# Patient Record
Sex: Female | Born: 1995 | Race: Black or African American | Hispanic: No | Marital: Single
Health system: Midwestern US, Community
[De-identification: ages and names within clinical notes are randomized; demographics above are authoritative.]

## PROBLEM LIST (undated history)

## (undated) ENCOUNTER — Inpatient Hospital Stay (HOSPITAL_COMMUNITY): Payer: Self-pay

## (undated) DIAGNOSIS — O039 Complete or unspecified spontaneous abortion without complication: Secondary | ICD-10-CM

## (undated) DIAGNOSIS — F5101 Primary insomnia: Secondary | ICD-10-CM

## (undated) DIAGNOSIS — J0140 Acute pansinusitis, unspecified: Secondary | ICD-10-CM

---

## 2015-05-11 ENCOUNTER — Encounter (HOSPITAL_COMMUNITY): Payer: Self-pay

## 2015-05-11 ENCOUNTER — Emergency Department (HOSPITAL_COMMUNITY)
Admission: EM | Admit: 2015-05-11 | Discharge: 2015-05-11 | Disposition: A | Discharge status: Home / Self Care | Payer: BLUE CROSS/BLUE SHIELD | Attending: Emergency Medicine | Admitting: Emergency Medicine

## 2015-05-11 ENCOUNTER — Emergency Department (HOSPITAL_COMMUNITY): Payer: BLUE CROSS/BLUE SHIELD

## 2015-05-11 DIAGNOSIS — O039 Complete or unspecified spontaneous abortion without complication: Secondary | ICD-10-CM | POA: Diagnosis not present

## 2015-05-11 DIAGNOSIS — R000 Tachycardia, unspecified: Secondary | ICD-10-CM | POA: Insufficient documentation

## 2015-05-11 DIAGNOSIS — Z79899 Other long term (current) drug therapy: Secondary | ICD-10-CM | POA: Insufficient documentation

## 2015-05-11 DIAGNOSIS — Z3A11 11 weeks gestation of pregnancy: Secondary | ICD-10-CM | POA: Insufficient documentation

## 2015-05-11 DIAGNOSIS — O9989 Other specified diseases and conditions complicating pregnancy, childbirth and the puerperium: Secondary | ICD-10-CM | POA: Diagnosis not present

## 2015-05-11 DIAGNOSIS — O209 Hemorrhage in early pregnancy, unspecified: Secondary | ICD-10-CM | POA: Diagnosis present

## 2015-05-11 DIAGNOSIS — N939 Abnormal uterine and vaginal bleeding, unspecified: Secondary | ICD-10-CM

## 2015-05-11 LAB — COMPREHENSIVE METABOLIC PANEL
ALK PHOS: 69 U/L (ref 38–126)
ALT: 17 U/L (ref 14–54)
ANION GAP: 10 (ref 5–15)
AST: 19 U/L (ref 15–41)
Albumin: 4.8 g/dL (ref 3.5–5.0)
BILIRUBIN TOTAL: 0.5 mg/dL (ref 0.3–1.2)
BUN: 13 mg/dL (ref 6–20)
CALCIUM: 9.8 mg/dL (ref 8.9–10.3)
CO2: 24 mmol/L (ref 22–32)
CREATININE: 0.8 mg/dL (ref 0.44–1.00)
Chloride: 107 mmol/L (ref 101–111)
Glucose, Bld: 95 mg/dL (ref 65–99)
Potassium: 3.9 mmol/L (ref 3.5–5.1)
Sodium: 141 mmol/L (ref 135–145)
TOTAL PROTEIN: 9 g/dL — AB (ref 6.5–8.1)

## 2015-05-11 LAB — CBC
HCT: 42 % (ref 36.0–46.0)
Hemoglobin: 13.9 g/dL (ref 12.0–15.0)
MCH: 30.4 pg (ref 26.0–34.0)
MCHC: 33.1 g/dL (ref 30.0–36.0)
MCV: 91.9 fL (ref 78.0–100.0)
PLATELETS: 415 10*3/uL — AB (ref 150–400)
RBC: 4.57 MIL/uL (ref 3.87–5.11)
RDW: 13.9 % (ref 11.5–15.5)
WBC: 5.1 10*3/uL (ref 4.0–10.5)

## 2015-05-11 LAB — ABO/RH: ABO/RH(D): A POS

## 2015-05-11 LAB — LIPASE, BLOOD: Lipase: 21 U/L (ref 11–51)

## 2015-05-11 LAB — I-STAT BETA HCG BLOOD, ED (MC, WL, AP ONLY): HCG, QUANTITATIVE: 78.2 m[IU]/mL — AB (ref <5)

## 2015-05-11 LAB — WET PREP, GENITAL
Sperm: NONE SEEN
Trich, Wet Prep: NONE SEEN
YEAST WET PREP: NONE SEEN

## 2015-05-11 LAB — FIBRINOGEN: FIBRINOGEN: 372 mg/dL (ref 204–475)

## 2015-05-11 LAB — PROTIME-INR
INR: 1.13 (ref 0.00–1.49)
Prothrombin Time: 14.6 seconds (ref 11.6–15.2)

## 2015-05-11 LAB — HCG, QUANTITATIVE, PREGNANCY: HCG, BETA CHAIN, QUANT, S: 71 m[IU]/mL — AB (ref <5)

## 2015-05-11 LAB — LACTATE DEHYDROGENASE: LDH: 148 U/L (ref 98–192)

## 2015-05-11 MED ORDER — OXYCODONE-ACETAMINOPHEN 5-325 MG PO TABS
2.0000 | ORAL_TABLET | Freq: Once | ORAL | Status: AC
  Administered 2015-05-11: 2 via ORAL
  Filled 2015-05-11: qty 2

## 2015-05-11 MED ORDER — NAPROXEN 375 MG PO TABS: 375.0000 mg | ORAL_TABLET | Freq: Two times a day (BID) | ORAL | Status: DC

## 2015-05-11 NOTE — Discharge Instructions (Signed)
HOME CARE INSTRUCTIONS   Your caregiver may order bed rest or may allow you to continue light activity. Resume activity as directed by your caregiver.  Have someone help with home and family responsibilities during this time.   Keep track of the number of sanitary pads you use each day and how soaked (saturated) they are. Write down this information.   Do not use tampons. Do not douche or have sexual intercourse until approved by your caregiver.   Only take over-the-counter or prescription medicines for pain or discomfort as directed by your caregiver.   Do not take aspirin. Aspirin can cause bleeding.   Keep all follow-up appointments with your caregiver.   If you or your partner have problems with grieving, talk to your caregiver or seek counseling to help cope with the pregnancy loss. Allow enough time to grieve before trying to get pregnant again.  SEEK IMMEDIATE MEDICAL CARE IF:   You have severe cramps or pain in your back or abdomen.  You have a fever.  You pass large blood clots (walnut-sized or larger) ortissue from your vagina. Save any tissue for your caregiver to inspect.   Your bleeding increases.   You have a thick, bad-smelling vaginal discharge.  You become lightheaded, weak, or you faint.   You have chills.   Miscarriage A miscarriage is the sudden loss of an unborn baby (fetus) before the 20th week of pregnancy. Most miscarriages happen in the first 3 months of pregnancy. Sometimes, it happens before a woman even knows she is pregnant. A miscarriage is also called a "spontaneous miscarriage" or "early pregnancy loss." Having a miscarriage can be an emotional experience. Talk with your caregiver about any questions you may have about miscarrying, the grieving process, and your future pregnancy plans. CAUSES   Problems with the fetal chromosomes that make it impossible for the baby to develop normally. Problems with the baby's genes or chromosomes  are most often the result of errors that occur, by chance, as the embryo divides and grows. The problems are not inherited from the parents.  Infection of the cervix or uterus.   Hormone problems.   Problems with the cervix, such as having an incompetent cervix. This is when the tissue in the cervix is not strong enough to hold the pregnancy.   Problems with the uterus, such as an abnormally shaped uterus, uterine fibroids, or congenital abnormalities.   Certain medical conditions.   Smoking, drinking alcohol, or taking illegal drugs.   Trauma.  Often, the cause of a miscarriage is unknown.  SYMPTOMS   Vaginal bleeding or spotting, with or without cramps or pain.  Pain or cramping in the abdomen or lower back.  Passing fluid, tissue, or blood clots from the vagina. DIAGNOSIS  Your caregiver will perform a physical exam. You may also have an ultrasound to confirm the miscarriage. Blood or urine tests may also be ordered. TREATMENT   Sometimes, treatment is not necessary if you naturally pass all the fetal tissue that was in the uterus. If some of the fetus or placenta remains in the body (incomplete miscarriage), tissue left behind may become infected and must be removed. Usually, a dilation and curettage (D and C) procedure is performed. During a D and C procedure, the cervix is widened (dilated) and any remaining fetal or placental tissue is gently removed from the uterus.  Antibiotic medicines are prescribed if there is an infection. Other medicines may be given to reduce the size of the uterus (  contract) if there is a lot of bleeding.  If you have Rh negative blood and your baby was Rh positive, you will need a Rh immunoglobulin shot. This shot will protect any future baby from having Rh blood problems in future pregnancies. MAKE SURE YOU:  Understand these instructions.  Will watch your condition.  Will get help right away if you are not doing well or get worse.     This information is not intended to replace advice given to you by your health care provider. Make sure you discuss any questions you have with your health care provider.   Document Released: 10/12/2000 Document Revised: 08/13/2012 Document Reviewed: 06/07/2011 Elsevier Interactive Patient Education [employer]

## 2015-05-11 NOTE — ED Provider Notes (Signed)
CSN: [id number]     Arrival date & time 05/11/15  1530 History   First MD Initiated Contact with Patient 05/11/15 1906     Chief Complaint  Patient presents with  . Abdominal Cramping  . Vaginal Bleeding     (Consider location/radiation/quality/duration/timing/severity/associated sxs/prior Treatment) HPI   Heather Webster is a(n) 20 y.o. female who presents to the ED with cc of vaginal bleeding and cramping. Her last period was in late October and she is currently pregnant. She is G3P0020. She complains of dyspareunia over the last 3 days. She had intercourse with her boyfriend last night. This morning she awoke and had some vaginal spotting. Around 9:00 AM she had intercourse again but had to stop because of pain. Patient states that after intercourse she had heavy vaginal bleeding. Her friend who is visiting solid bleeding and encouraged her to come to the emergency department for further evaluation. At any vaginal discharge, foul odor. She is monogamous with one female partner.   History reviewed. No pertinent past medical history. History reviewed. No pertinent past surgical history. History reviewed. No pertinent family history. Social History  Substance Use Topics  . Smoking status: Never Smoker   . Smokeless tobacco: None  . Alcohol Use: No   OB History    Gravida Para Term Preterm AB TAB SAB Ectopic Multiple Living   1              Review of Systems Ten systems reviewed and are negative for acute change, except as noted in the HPI.    Allergies  Review of patient's allergies indicates no known allergies.  Home Medications   Prior to Admission medications   Medication Sig Start Date End Date Taking? Authorizing Provider  Prenatal Vit-Fe Fumarate-FA (PRENATAL MULTIVITAMIN) TABS tablet Take 1 tablet by mouth daily at 12 noon.   Yes Historical Provider, MD   BP 132/89 mmHg  Pulse 108  Temp(Src) 98 F (36.7 C) (Oral)  Resp 18  SpO2 100% Physical Exam  Constitutional: She  is oriented to person, place, and time. She appears well-developed and well-nourished. No distress.  HENT:  Head: Normocephalic and atraumatic.  Eyes: Conjunctivae are normal. No scleral icterus.  Neck: Normal range of motion.  Cardiovascular: Normal rate, regular rhythm and normal heart sounds.  Exam reveals no gallop and no friction rub.   No murmur heard. Pulmonary/Chest: Effort normal and breath sounds normal. No respiratory distress.  Abdominal: Soft. Bowel sounds are normal. She exhibits no distension and no mass. There is no tenderness. There is no guarding.  Genitourinary:  Pelvic exam: VULVA: normal appearing vulva with no masses, tenderness or lesions, VAGINA: normal appearing vagina with normal color and discharge, no lesions, CERVIX: cervical discharge present - mucoid and bloody, UTERUS: uterus is normal size, shape, consistency and nontender, ADNEXA: normal adnexa in size,no masses, exam limited by diffuse tenderness.   Neurological: She is alert and oriented to person, place, and time.  Skin: Skin is warm and dry. She is not diaphoretic.    ED Course  Procedures (including critical care time) Labs Review Labs Reviewed  WET PREP, GENITAL - Abnormal; Notable for the following:    Clue Cells Wet Prep HPF POC PRESENT (*)    WBC, Wet Prep HPF POC FEW (*)    All other components within normal limits  COMPREHENSIVE METABOLIC PANEL - Abnormal; Notable for the following:    Total Protein 9.0 (*)    All other components within normal limits  CBC -  Abnormal; Notable for the following:    Platelets 415 (*)    All other components within normal limits  I-STAT BETA HCG BLOOD, ED ([facility], [facility], [facility] ONLY) - Abnormal; Notable for the following:    I-stat hCG, quantitative 78.2 (*)    All other components within normal limits  LIPASE, BLOOD  LACTATE DEHYDROGENASE  PROTIME-INR  FIBRINOGEN  URINALYSIS, ROUTINE W REFLEX MICROSCOPIC (NOT AT [facility])  HCG, QUANTITATIVE, PREGNANCY  RPR  HIV  ANTIBODY (ROUTINE TESTING)  HAPTOGLOBIN  ABO/RH  GC/CHLAMYDIA PROBE AMP ([place]) NOT AT [facility]    Imaging Review No results found. I have personally reviewed and evaluated these images and lab results as part of my medical decision-making.   EKG Interpretation None      MDM   Final diagnoses:  Spontaneous miscarriage    Patient with low hCG for her expected gestational age of [redacted] weeks. Mild tachycardia and persistent bleeding. No products of retained conception in the ultrasound. She does have a thickened endometrium. Workup is suggestive of a spontaneous abortion. Patient has had 2 prior miscarriages. I spoke with Dr. Adrian BlackwaterStinson, who is on-call for OB/GYN. The patient [facility] quantitative [facility]. I discussed reasons to seek immediate care at the emergency department. No intercourse until she is cleared by OB/GYN. Naproxen at discharge. Patient appears safe for discharge at this time    Arthor Captainbigail Ranisha Allaire, PA-C 05/11/15 2220  Tilden FossaElizabeth Rees, MD 05/12/15 Moses [id number]

## 2015-05-11 NOTE — ED Notes (Addendum)
Pt c/o abdominal cramping and vaginal bleeding starting this morning.  Pain score 8/10.  Pt has not taken anything for symptoms.  Pt believes she is around [redacted] weeks pregnant.  Sts "I was told on November 26th that I was pregnant and they thought I was around 6 weeks."  Hx of prior miscarriages.         When asked to quantify the bleeding, the Pt reported that her panties was wet w/ blood when she woke up.  Since, she has been lining her panties w/ toilet paper and it has been adequate.  Pt provided a pad.

## 2015-05-11 NOTE — ED Notes (Signed)
Patient transported to Ultrasound 

## 2015-05-11 NOTE — ED Notes (Signed)
PA at bedside.

## 2015-05-12 ENCOUNTER — Emergency Department (HOSPITAL_COMMUNITY)
Admission: EM | Admit: 2015-05-12 | Discharge: 2015-05-12 | Disposition: A | Discharge status: Home / Self Care | Payer: BLUE CROSS/BLUE SHIELD | Attending: Emergency Medicine | Admitting: Emergency Medicine

## 2015-05-12 ENCOUNTER — Encounter (HOSPITAL_COMMUNITY): Payer: Self-pay | Admitting: Family Medicine

## 2015-05-12 DIAGNOSIS — Z3A00 Weeks of gestation of pregnancy not specified: Secondary | ICD-10-CM | POA: Insufficient documentation

## 2015-05-12 DIAGNOSIS — O209 Hemorrhage in early pregnancy, unspecified: Secondary | ICD-10-CM | POA: Diagnosis present

## 2015-05-12 DIAGNOSIS — O039 Complete or unspecified spontaneous abortion without complication: Secondary | ICD-10-CM | POA: Diagnosis not present

## 2015-05-12 DIAGNOSIS — Z791 Long term (current) use of non-steroidal anti-inflammatories (NSAID): Secondary | ICD-10-CM | POA: Insufficient documentation

## 2015-05-12 DIAGNOSIS — N939 Abnormal uterine and vaginal bleeding, unspecified: Secondary | ICD-10-CM

## 2015-05-12 DIAGNOSIS — Z79899 Other long term (current) drug therapy: Secondary | ICD-10-CM | POA: Insufficient documentation

## 2015-05-12 HISTORY — DX: Complete or unspecified spontaneous abortion without complication: O03.9

## 2015-05-12 LAB — CBC
HCT: 36.7 % (ref 36.0–46.0)
Hemoglobin: 12.3 g/dL (ref 12.0–15.0)
MCH: 30 pg (ref 26.0–34.0)
MCHC: 33.5 g/dL (ref 30.0–36.0)
MCV: 89.5 fL (ref 78.0–100.0)
PLATELETS: 353 10*3/uL (ref 150–400)
RBC: 4.1 MIL/uL (ref 3.87–5.11)
RDW: 13.6 % (ref 11.5–15.5)
WBC: 7.2 10*3/uL (ref 4.0–10.5)

## 2015-05-12 LAB — HIV ANTIBODY (ROUTINE TESTING W REFLEX): HIV Screen 4th Generation wRfx: NONREACTIVE

## 2015-05-12 LAB — BASIC METABOLIC PANEL
Anion gap: 9 (ref 5–15)
BUN: 10 mg/dL (ref 6–20)
CALCIUM: 9.2 mg/dL (ref 8.9–10.3)
CO2: 23 mmol/L (ref 22–32)
CREATININE: 0.58 mg/dL (ref 0.44–1.00)
Chloride: 108 mmol/L (ref 101–111)
GFR calc non Af Amer: >60 mL/min (ref >60)
GLUCOSE: 103 mg/dL — AB (ref 65–99)
Potassium: 3.5 mmol/L (ref 3.5–5.1)
Sodium: 140 mmol/L (ref 135–145)

## 2015-05-12 LAB — GC/CHLAMYDIA PROBE AMP (~~LOC~~) NOT AT ARMC
CHLAMYDIA, DNA PROBE: NEGATIVE
NEISSERIA GONORRHEA: NEGATIVE

## 2015-05-12 LAB — RPR: RPR: NONREACTIVE

## 2015-05-12 MED ORDER — ONDANSETRON HCL 4 MG/2ML IJ SOLN
4.0000 mg | Freq: Once | INTRAMUSCULAR | Status: AC
  Administered 2015-05-12: 4 mg via INTRAVENOUS
  Filled 2015-05-12: qty 2

## 2015-05-12 MED ORDER — OXYCODONE-ACETAMINOPHEN 5-325 MG PO TABS: 1.0000 | ORAL_TABLET | Freq: Four times a day (QID) | ORAL | Status: DC | PRN

## 2015-05-12 MED ORDER — KETOROLAC TROMETHAMINE 30 MG/ML IJ SOLN
30.0000 mg | Freq: Once | INTRAMUSCULAR | Status: AC
  Administered 2015-05-12: 30 mg via INTRAVENOUS
  Filled 2015-05-12: qty 1

## 2015-05-12 MED ORDER — SODIUM CHLORIDE 0.9 % IV BOLUS (SEPSIS)
1000.0000 mL | Freq: Once | INTRAVENOUS | Status: AC
  Administered 2015-05-12: 1000 mL via INTRAVENOUS

## 2015-05-12 MED ORDER — MORPHINE SULFATE (PF) 4 MG/ML IV SOLN
2.0000 mg | Freq: Once | INTRAVENOUS | Status: AC
  Administered 2015-05-12: 2 mg via INTRAVENOUS
  Filled 2015-05-12: qty 1

## 2015-05-12 MED ORDER — OXYCODONE-ACETAMINOPHEN 5-325 MG PO TABS
2.0000 | ORAL_TABLET | Freq: Once | ORAL | Status: AC
  Administered 2015-05-12: 2 via ORAL
  Filled 2015-05-12: qty 2

## 2015-05-12 NOTE — ED Notes (Signed)
[facility]. Pt is having increase pain and vaginal bleeding. Friend reports she is going through 3 pads in an hour.

## 2015-05-12 NOTE — Discharge Instructions (Signed)
Take naproxen for mild to moderate pain and Percocet as needed for severe pain. Follow-up with [facility] or a recheck of your hCG level on 05/13/2015. Return to [facility] emergency department if symptoms worsen. You may continue bleeding over the next few days. You should be seen prior to your scheduled follow-up if you develop a fever, severe lightheadedness or dizziness, or loss of consciousness. If bleeding becomes severe, bleeding through more than 1 pad every hour for greater than or equal to 3 hours, follow up with [facility] ED as well. Return to this ED as needed for worsening symptoms.  Miscarriage A miscarriage is the sudden loss of an unborn baby (fetus) before the 20th week of pregnancy. Most miscarriages happen in the first 3 months of pregnancy. Sometimes, it happens before a woman even knows she is pregnant. A miscarriage is also called a "spontaneous miscarriage" or "early pregnancy loss." Having a miscarriage can be an emotional experience. Talk with your caregiver about any questions you may have about miscarrying, the grieving process, and your future pregnancy plans. CAUSES   Problems with the fetal chromosomes that make it impossible for the baby to develop normally. Problems with the baby's genes or chromosomes are most often the result of errors that occur, by chance, as the embryo divides and grows. The problems are not inherited from the parents.  Infection of the cervix or uterus.   Hormone problems.   Problems with the cervix, such as having an incompetent cervix. This is when the tissue in the cervix is not strong enough to hold the pregnancy.   Problems with the uterus, such as an abnormally shaped uterus, uterine fibroids, or congenital abnormalities.   Certain medical conditions.   Smoking, drinking alcohol, or taking illegal drugs.   Trauma.  Often, the cause of a miscarriage is unknown.  SYMPTOMS   Vaginal bleeding or spotting, with  or without cramps or pain.  Pain or cramping in the abdomen or lower back.  Passing fluid, tissue, or blood clots from the vagina. DIAGNOSIS  Your caregiver will perform a physical exam. You may also have an ultrasound to confirm the miscarriage. Blood or urine tests may also be ordered. TREATMENT   Sometimes, treatment is not necessary if you naturally pass all the fetal tissue that was in the uterus. If some of the fetus or placenta remains in the body (incomplete miscarriage), tissue left behind may become infected and must be removed. Usually, a dilation and curettage (D and C) procedure is performed. During a D and C procedure, the cervix is widened (dilated) and any remaining fetal or placental tissue is gently removed from the uterus.  Antibiotic medicines are prescribed if there is an infection. Other medicines may be given to reduce the size of the uterus (contract) if there is a lot of bleeding.  If you have Rh negative blood and your baby was Rh positive, you will need a Rh immunoglobulin shot. This shot will protect any future baby from having Rh blood problems in future pregnancies. HOME CARE INSTRUCTIONS   Your caregiver may order bed rest or may allow you to continue light activity. Resume activity as directed by your caregiver.  Have someone help with home and family responsibilities during this time.   Keep track of the number of sanitary pads you use each day and how soaked (saturated) they are. Write down this information.   Do not use tampons. Do not douche or have sexual intercourse until approved by  your caregiver.   Only take over-the-counter or prescription medicines for pain or discomfort as directed by your caregiver.   Do not take aspirin. Aspirin can cause bleeding.   Keep all follow-up appointments with your caregiver.   If you or your partner have problems with grieving, talk to your caregiver or seek counseling to help cope with the pregnancy loss.  Allow enough time to grieve before trying to get pregnant again.  SEEK IMMEDIATE MEDICAL CARE IF:   You have severe cramps or pain in your back or abdomen.  You have a fever.  You pass large blood clots (walnut-sized or larger) ortissue from your vagina. Save any tissue for your caregiver to inspect.   Your bleeding increases.   You have a thick, bad-smelling vaginal discharge.  You become lightheaded, weak, or you faint.   You have chills.  MAKE SURE YOU:  Understand these instructions.  Will watch your condition.  Will get help right away if you are not doing well or get worse.   This information is not intended to replace advice given to you by your health care provider. Make sure you discuss any questions you have with your health care provider.   Document Released: 10/12/2000 Document Revised: 08/13/2012 Document Reviewed: 06/07/2011 Elsevier Interactive Patient Education [employer]

## 2015-05-12 NOTE — ED Notes (Signed)
Unsuccessful IV attempt by this nurse. Another nurse will attempt

## 2015-05-12 NOTE — ED Notes (Signed)
Pt states that she does not want any blood work drawn

## 2015-05-12 NOTE — ED Provider Notes (Signed)
CSN: [id number]     Arrival date & time 05/12/15  0109 History   First MD Initiated Contact with Patient 05/12/15 0135     Chief Complaint  Patient presents with  . Vaginal Bleeding     (Consider location/radiation/quality/duration/timing/severity/associated sxs/prior Treatment) HPI Comments: Patient is a 20 year old [id number] female who presents to the emergency department a few hours following discharge for worsening abdominal pain and vaginal bleeding. She was previously seen and diagnosed with a likely miscarriage. She has instruction to f/u at the [facility] in 2 days. She reported that her last period was in late October. She had dyspareunia over the last 3 days and started experiencing vaginal spotting yesterday morning. She reports that over the last 1-2 hours she has soaked through 3 pads due to worsening vaginal bleeding. She reports passing some clots. Her pain has also worsened and is sharp, present in her pelvic and suprapubic region. She previously had adequate pain control with Percocet. She was not able to take any of the naproxen prescribed to her following discharge. Patient has not had any fever or loss of consciousness. No vomiting. She reported monogamy with 1 female partner.  Patient is a 20 y.o. female presenting with vaginal bleeding. The history is provided by the patient. No language interpreter was used.  Vaginal Bleeding Associated symptoms: no fever     Past Medical History  Diagnosis Date  . Miscarriage    History reviewed. No pertinent past surgical history. History reviewed. No pertinent family history. Social History  Substance Use Topics  . Smoking status: Never Smoker   . Smokeless tobacco: None  . Alcohol Use: No   OB History    Gravida Para Term Preterm AB TAB SAB Ectopic Multiple Living   1               Review of Systems  Constitutional: Negative for fever.  Gastrointestinal: Negative for vomiting.  Genitourinary: Positive for  vaginal bleeding and pelvic pain.  Neurological: Negative for syncope.  All other systems reviewed and are negative.   Allergies  Review of patient's allergies indicates no known allergies.  Home Medications   Prior to Admission medications   Medication Sig Start Date End Date Taking? Authorizing Provider  Prenatal Vit-Fe Fumarate-FA (PRENATAL MULTIVITAMIN) TABS tablet Take 1 tablet by mouth daily at 12 noon.   Yes Historical Provider, MD  naproxen (NAPROSYN) 375 MG tablet Take 1 tablet (375 mg total) by mouth 2 (two) times daily. 05/11/15   Abigail Harris, PA-C   BP 100/89 mmHg  Pulse 111  Temp(Src) 98.1 F (36.7 C) (Oral)  Resp 20  SpO2 99%   Physical Exam  Constitutional: She is oriented to person, place, and time. She appears well-developed and well-nourished. No distress.  Patient appears uncomfortable  HENT:  Head: Normocephalic and atraumatic.  Eyes: Conjunctivae and EOM are normal. No scleral icterus.  Neck: Normal range of motion.  Cardiovascular: Regular rhythm and intact distal pulses.   Pulmonary/Chest: Effort normal. No respiratory distress. She has no wheezes.  Respirations even and unlabored  Abdominal: Soft. Normal appearance. She exhibits no distension and no ascites. There is tenderness. There is no rebound and no tenderness at McBurney's point.    Soft, nondistended abdomen with tenderness to palpation in the suprapubic abdomen. No masses or peritoneal signs.  Musculoskeletal: Normal range of motion.  Neurological: She is alert and oriented to person, place, and time. She exhibits normal muscle tone. Coordination normal.  GCS 15. Patient moving  all extremities.  Skin: Skin is warm and dry. No rash noted. She is not diaphoretic. No erythema. No pallor.  Psychiatric: Her behavior is normal. Her mood appears anxious.  Nursing note and vitals reviewed.   ED Course  Procedures (including critical care time) Labs Review Labs Reviewed  BASIC METABOLIC PANEL -  Abnormal; Notable for the following:    Glucose, Bld 103 (*)    All other components within normal limits  CBC    Imaging Review [place] Ob Comp Less 14 Wks  05/11/2015  CLINICAL DATA:  Pregnant patient with vaginal bleeding and right-sided abdominal pain. Beta HCG 78. Rule out ectopic or torsion. Patient reports approximately [redacted] weeks pregnant. EXAM: OBSTETRIC <14 WK US AND TRANSVAGINAL OB US DOPPLER ULTRASOUND OF OVARIES TECHNIQUE: Both transabdominal and transvaginal ultrasound examinations were performed for complete evaluation of the gestation as well as the maternal uterus, adnexal regions, and pelvic cul-de-sac. Transvaginal technique was performed to assess early pregnancy. Color and duplex Doppler ultrasound was utilized to evaluate blood flow to the ovaries. COMPARISON:  None. FINDINGS: Intrauterine gestational sac: Not present. Yolk sac:  Not present. Embryo:  Not present. Maternal uterus/adnexae: The uterus is retroverted measuring 7.7 x 5.1 x 4.8 cm. There is no intrauterine gestational sac. Diffuse thickening of the endometrium measuring up to 1.8 cm containing heterogeneous echogenicity and small amount of fluid. The right ovary appears normal measuring 2.9 x 0.8 x 2.1 cm. Blood flow seen. No right adnexal mass. The left ovary appears normal measuring 2.3 x 2.0 x 2.9 cm with blood flow. No left adnexal mass. Trace pelvic free fluid. Pulsed Doppler evaluation of both ovaries demonstrates normal appearing low-resistance arterial and venous waveforms. IMPRESSION: 1. No intrauterine gestation. No findings to suggest ectopic pregnancy. Heterogeneous thickened endometrium, may reflect failed pregnancy versus less likely very early intrauterine pregnancy given the degree of heterogeneity and last menstrual period. Recommend continued trending of beta HCG, and sonographic follow-up as indicated. 2. Normal appearance of both ovaries. Normal blood flow. No ovarian torsion, particularly on the right.  Electronically Signed   By: Rubye OaksMelanie  Ehinger M.D.   On: 05/11/2015 20:51   [place] Ob Transvaginal  05/11/2015  CLINICAL DATA:  Pregnant patient with vaginal bleeding and right-sided abdominal pain. Beta HCG 78. Rule out ectopic or torsion. Patient reports approximately [redacted] weeks pregnant. EXAM: OBSTETRIC <14 WK US AND TRANSVAGINAL OB US DOPPLER ULTRASOUND OF OVARIES TECHNIQUE: Both transabdominal and transvaginal ultrasound examinations were performed for complete evaluation of the gestation as well as the maternal uterus, adnexal regions, and pelvic cul-de-sac. Transvaginal technique was performed to assess early pregnancy. Color and duplex Doppler ultrasound was utilized to evaluate blood flow to the ovaries. COMPARISON:  None. FINDINGS: Intrauterine gestational sac: Not present. Yolk sac:  Not present. Embryo:  Not present. Maternal uterus/adnexae: The uterus is retroverted measuring 7.7 x 5.1 x 4.8 cm. There is no intrauterine gestational sac. Diffuse thickening of the endometrium measuring up to 1.8 cm containing heterogeneous echogenicity and small amount of fluid. The right ovary appears normal measuring 2.9 x 0.8 x 2.1 cm. Blood flow seen. No right adnexal mass. The left ovary appears normal measuring 2.3 x 2.0 x 2.9 cm with blood flow. No left adnexal mass. Trace pelvic free fluid. Pulsed Doppler evaluation of both ovaries demonstrates normal appearing low-resistance arterial and venous waveforms. IMPRESSION: 1. No intrauterine gestation. No findings to suggest ectopic pregnancy. Heterogeneous thickened endometrium, may reflect failed pregnancy versus less likely very early intrauterine pregnancy given the  degree of heterogeneity and last menstrual period. Recommend continued trending of beta HCG, and sonographic follow-up as indicated. 2. Normal appearance of both ovaries. Normal blood flow. No ovarian torsion, particularly on the right. Electronically Signed   By: Rubye Oaks M.D.   On: 05/11/2015 20:51    [place] Art/ven Flow Abd Pelv Doppler Limited  05/11/2015  CLINICAL DATA:  Pregnant patient with vaginal bleeding and right-sided abdominal pain. Beta HCG 78. Rule out ectopic or torsion. Patient reports approximately [redacted] weeks pregnant. EXAM: OBSTETRIC <14 WK [place] AND TRANSVAGINAL OB US DOPPLER ULTRASOUND OF OVARIES TECHNIQUE: Both transabdominal and transvaginal ultrasound examinations were performed for complete evaluation of the gestation as well as the maternal uterus, adnexal regions, and pelvic cul-de-sac. Transvaginal technique was performed to assess early pregnancy. Color and duplex Doppler ultrasound was utilized to evaluate blood flow to the ovaries. COMPARISON:  None. FINDINGS: Intrauterine gestational sac: Not present. Yolk sac:  Not present. Embryo:  Not present. Maternal uterus/adnexae: The uterus is retroverted measuring 7.7 x 5.1 x 4.8 cm. There is no intrauterine gestational sac. Diffuse thickening of the endometrium measuring up to 1.8 cm containing heterogeneous echogenicity and small amount of fluid. The right ovary appears normal measuring 2.9 x 0.8 x 2.1 cm. Blood flow seen. No right adnexal mass. The left ovary appears normal measuring 2.3 x 2.0 x 2.9 cm with blood flow. No left adnexal mass. Trace pelvic free fluid. Pulsed Doppler evaluation of both ovaries demonstrates normal appearing low-resistance arterial and venous waveforms. IMPRESSION: 1. No intrauterine gestation. No findings to suggest ectopic pregnancy. Heterogeneous thickened endometrium, may reflect failed pregnancy versus less likely very early intrauterine pregnancy given the degree of heterogeneity and last menstrual period. Recommend continued trending of beta HCG, and sonographic follow-up as indicated. 2. Normal appearance of both ovaries. Normal blood flow. No ovarian torsion, particularly on the right. Electronically Signed   By: Rubye Oaks M.D.   On: 05/11/2015 20:51   I have personally reviewed and evaluated these  images and lab results as part of my medical decision-making.   EKG Interpretation None      3:13 AM Patient states pain has improved. Hgb fairly stable compared to prior work up. Patient is not orthostatic. Tachycardia has resolved with pain control. Abdominal exam improved. Patient has had mild/minimal bleeding since arrival despite reporting soaking through 3 pads in a 1 hour period. Will continue to monitor and reassess in 1 hour.  4:13 AM Patient reassessed. She has had no further bleeding compared to 1 hour ago. Pain is well controlled. Have discussed the need to f/u with [facility] in 2 days. Return precautions discussed at bedside. Patient verbalizes understanding. MDM   Final diagnoses:  Spontaneous miscarriage  Vaginal bleeding    20 year old female presents to the emergency department for worsening abdominal pain and vaginal bleeding. She was discharged from the ED 3 hours prior to return after being diagnosed with a likely miscarriage. Patient reports that her pain medication were off and she was unable to fill her prescription for additional pain control. She also reports that her bleeding has worsened and that she soaked through 3 pads in a 1 hour period. She denies any loss of consciousness or lightheadedness. No fever since discharge.  Patient treated in the emergency department with IV fluids as well as Toradol and morphine. She has had adequate pain control with these medications and has been resting comfortably over the past 3 hours. Patient has also not had continued bleeding since  arrival. Her hemoglobin is stable compared to her prior evaluation. Patient is not orthostatic and her tachycardia has resolved with pain control.  Patient is to follow-up with [facility] on 05/13/2015 for a recheck/trending of her hCG, per Dr. Adrian Blackwater who was consulted by Manuela Neptune at initial ED visit. I believe the patient is stable for discharge for outpatient follow-up at this  time. Will prescribe Percocet for additional pain control as needed. Patient has been told to return to [facility] for worsening symptoms. Return precautions discussed at bedside and provided at discharge. Patient agreeable to plan with no unaddressed concerns. Patient discharged in satisfactory condition with no unaddressed concerns.   Filed Vitals:   05/12/15 0129 05/12/15 0247  BP: 100/89 114/81  Pulse: 111 85  Temp: 98.1 F (36.7 C)   TempSrc: Oral   Resp: 20 20  SpO2: 99% 100%     Antony Madura, PA-C 05/12/15 0450  Gilda Crease, MD 05/20/15 [phone]

## 2015-05-13 LAB — HAPTOGLOBIN: Haptoglobin: 168 mg/dL (ref 34–200)

## 2015-05-14 ENCOUNTER — Other Ambulatory Visit: Payer: BLUE CROSS/BLUE SHIELD

## 2015-05-14 DIAGNOSIS — O3680X0 Pregnancy with inconclusive fetal viability, not applicable or unspecified: Secondary | ICD-10-CM

## 2015-05-15 LAB — HCG, QUANTITATIVE, PREGNANCY: hCG, Beta Chain, Quant, S: 6.9 m[IU]/mL — ABNORMAL HIGH

## 2015-07-21 ENCOUNTER — Inpatient Hospital Stay (HOSPITAL_COMMUNITY)
Admission: AD | Admit: 2015-07-21 | Discharge: 2015-07-21 | Disposition: A | Discharge status: Home / Self Care | Payer: BLUE CROSS/BLUE SHIELD | Source: Ambulatory Visit | Attending: Obstetrics & Gynecology | Admitting: Obstetrics & Gynecology

## 2015-07-21 ENCOUNTER — Encounter (HOSPITAL_COMMUNITY): Payer: Self-pay

## 2015-07-21 DIAGNOSIS — B9689 Other specified bacterial agents as the cause of diseases classified elsewhere: Secondary | ICD-10-CM | POA: Diagnosis not present

## 2015-07-21 DIAGNOSIS — N939 Abnormal uterine and vaginal bleeding, unspecified: Secondary | ICD-10-CM | POA: Insufficient documentation

## 2015-07-21 DIAGNOSIS — F32A Depression, unspecified: Secondary | ICD-10-CM

## 2015-07-21 DIAGNOSIS — A499 Bacterial infection, unspecified: Secondary | ICD-10-CM | POA: Diagnosis not present

## 2015-07-21 DIAGNOSIS — N760 Acute vaginitis: Secondary | ICD-10-CM | POA: Insufficient documentation

## 2015-07-21 DIAGNOSIS — F329 Major depressive disorder, single episode, unspecified: Secondary | ICD-10-CM | POA: Diagnosis not present

## 2015-07-21 LAB — URINALYSIS, ROUTINE W REFLEX MICROSCOPIC
BILIRUBIN URINE: NEGATIVE
GLUCOSE, UA: NEGATIVE mg/dL
KETONES UR: 15 mg/dL — AB
Leukocytes, UA: NEGATIVE
Nitrite: NEGATIVE
PH: 5.5 (ref 5.0–8.0)
Protein, ur: NEGATIVE mg/dL
Specific Gravity, Urine: 1.02 (ref 1.005–1.030)

## 2015-07-21 LAB — URINE MICROSCOPIC-ADD ON

## 2015-07-21 LAB — WET PREP, GENITAL
SPERM: NONE SEEN
TRICH WET PREP: NONE SEEN
YEAST WET PREP: NONE SEEN

## 2015-07-21 LAB — CBC
HCT: 36.5 % (ref 36.0–46.0)
Hemoglobin: 12.5 g/dL (ref 12.0–15.0)
MCH: 30.7 pg (ref 26.0–34.0)
MCHC: 34.2 g/dL (ref 30.0–36.0)
MCV: 89.7 fL (ref 78.0–100.0)
PLATELETS: 315 10*3/uL (ref 150–400)
RBC: 4.07 MIL/uL (ref 3.87–5.11)
RDW: 13.5 % (ref 11.5–15.5)
WBC: 4.5 10*3/uL (ref 4.0–10.5)

## 2015-07-21 LAB — POCT PREGNANCY, URINE: Preg Test, Ur: NEGATIVE

## 2015-07-21 LAB — HCG, QUANTITATIVE, PREGNANCY: hCG, Beta Chain, Quant, S: <1 m[IU]/mL (ref <5)

## 2015-07-21 MED ORDER — SERTRALINE HCL 50 MG PO TABS: 50.0000 mg | ORAL_TABLET | Freq: Every day | ORAL | Status: AC

## 2015-07-21 MED ORDER — METRONIDAZOLE 500 MG PO TABS: 500.0000 mg | ORAL_TABLET | Freq: Two times a day (BID) | ORAL | Status: DC

## 2015-07-21 MED ORDER — NORGESTIMATE-ETH ESTRADIOL 0.25-35 MG-MCG PO TABS: 1.0000 | ORAL_TABLET | Freq: Every day | ORAL | Status: DC

## 2015-07-21 NOTE — Discharge Instructions (Signed)
Abnormal Uterine Bleeding Abnormal uterine bleeding can affect women at various stages in life, including teenagers, women in their reproductive years, pregnant women, and women who have reached menopause. Several kinds of uterine bleeding are considered abnormal, including:  Bleeding or spotting between periods.   Bleeding after sexual intercourse.   Bleeding that is heavier or more than normal.   Periods that last longer than usual.  Bleeding after menopause.  Many cases of abnormal uterine bleeding are minor and simple to treat, while others are more serious. Any type of abnormal bleeding should be evaluated by your health care provider. Treatment will depend on the cause of the bleeding. HOME CARE INSTRUCTIONS Monitor your condition for any changes. The following actions may help to alleviate any discomfort you are experiencing:  Avoid the use of tampons and douches as directed by your health care provider.  Change your pads frequently. You should get regular pelvic exams and Pap tests. Keep all follow-up appointments for diagnostic tests as directed by your health care provider.  SEEK MEDICAL CARE IF:   Your bleeding lasts more than 1 week.   You feel dizzy at times.  SEEK IMMEDIATE MEDICAL CARE IF:   You pass out.   You are changing pads every 15 to 30 minutes.   You have abdominal pain.  You have a fever.   You become sweaty or weak.   You are passing large blood clots from the vagina.   You start to feel nauseous and vomit. MAKE SURE YOU:   Understand these instructions.  Will watch your condition.  Will get help right away if you are not doing well or get worse.   This information is not intended to replace advice given to you by your health care provider. Make sure you discuss any questions you have with your health care provider.   Document Released: 04/18/2005 Document Revised: 04/23/2013 Document Reviewed: 11/15/2012 Elsevier Interactive  Patient Education 2016 Elsevier Inc. Major Depressive Disorder Major depressive disorder is a mental illness. It also may be called clinical depression or unipolar depression. Major depressive disorder usually causes feelings of sadness, hopelessness, or helplessness. Some people with this disorder do not feel particularly sad but lose interest in doing things they used to enjoy (anhedonia). Major depressive disorder also can cause physical symptoms. It can interfere with work, school, relationships, and other normal everyday activities. The disorder varies in severity but is longer lasting and more serious than the sadness we all feel from time to time in our lives. Major depressive disorder often is triggered by stressful life events or major life changes. Examples of these triggers include divorce, loss of your job or home, a move, and the death of a family member or close friend. Sometimes this disorder occurs for no obvious reason at all. People who have family members with major depressive disorder or bipolar disorder are at higher risk for developing this disorder, with or without life stressors. Major depressive disorder can occur at any age. It may occur just once in your life (single episode major depressive disorder). It may occur multiple times (recurrent major depressive disorder). SYMPTOMS People with major depressive disorder have either anhedonia or depressed mood on nearly a daily basis for at least 2 weeks or longer. Symptoms of depressed mood include:  Feelings of sadness (blue or down in the dumps) or emptiness.  Feelings of hopelessness or helplessness.  Tearfulness or episodes of crying (may be observed by others).  Irritability (children and adolescents). In addition  to depressed mood or anhedonia or both, people with this disorder have at least four of the following symptoms:  Difficulty sleeping or sleeping too much.   Significant change (increase or decrease) in appetite  or weight.   Lack of energy or motivation.  Feelings of guilt and worthlessness.   Difficulty concentrating, remembering, or making decisions.  Unusually slow movement (psychomotor retardation) or restlessness (as observed by others).   Recurrent wishes for death, recurrent thoughts of self-harm (suicide), or a suicide attempt. People with major depressive disorder commonly have persistent negative thoughts about themselves, other people, and the world. People with severe major depressive disorder may experiencedistorted beliefs or perceptions about the world (psychotic delusions). They also may see or hear things that are not real (psychotic hallucinations). DIAGNOSIS Major depressive disorder is diagnosed through an assessment by your health care provider. Your health care provider will ask aboutaspects of your daily life, such as mood,sleep, and appetite, to see if you have the diagnostic symptoms of major depressive disorder. Your health care provider may ask about your medical history and use of alcohol or drugs, including prescription medicines. Your health care provider also may do a physical exam and blood work. This is because certain medical conditions and the use of certain substances can cause major depressive disorder-like symptoms (secondary depression). Your health care provider also may refer you to a mental health specialist for further evaluation and treatment. TREATMENT It is important to recognize the symptoms of major depressive disorder and seek treatment. The following treatments can be prescribed for this disorder:   Medicine. Antidepressant medicines usually are prescribed. Antidepressant medicines are thought to correct chemical imbalances in the brain that are commonly associated with major depressive disorder. Other types of medicine may be added if the symptoms do not respond to antidepressant medicines alone or if psychotic delusions or hallucinations  occur.  Talk therapy. Talk therapy can be helpful in treating major depressive disorder by providing support, education, and guidance. Certain types of talk therapy also can help with negative thinking (cognitive behavioral therapy) and with relationship issues that trigger this disorder (interpersonal therapy). A mental health specialist can help determine which treatment is best for you. Most people with major depressive disorder do well with a combination of medicine and talk therapy. Treatments involving electrical stimulation of the brain can be used in situations with extremely severe symptoms or when medicine and talk therapy do not work over time. These treatments include electroconvulsive therapy, transcranial magnetic stimulation, and vagal nerve stimulation.   This information is not intended to replace advice given to you by your health care provider. Make sure you discuss any questions you have with your health care provider.   Document Released: 08/13/2012 Document Revised: 05/09/2014 Document Reviewed: 08/13/2012 Elsevier Interactive Patient Education 2016 [employer] Fluoxetine capsules or tablets (Depression/Mood Disorders) What is this medicine? FLUOXETINE (floo OX e teen) belongs to a class of drugs known as selective serotonin reuptake inhibitors (SSRIs). It helps to treat mood problems such as depression, obsessive compulsive disorder, and panic attacks. It can also treat certain eating disorders. This medicine may be used for other purposes; ask your health care provider or pharmacist if you have questions. What should I tell my health care provider before I take this medicine? They need to know if you have any of these conditions: -bipolar disorder or mania -diabetes -glaucoma -liver disease -psychosis -seizures -suicidal thoughts or history of attempted suicide -an unusual or allergic reaction to fluoxetine, other medicines, foods,  dyes, or preservatives -pregnant or  trying to get pregnant -breast-feeding How should I use this medicine? Take this medicine by mouth with a glass of water. Follow the directions on the prescription label. You can take this medicine with or without food. Take your medicine at regular intervals. Do not take it more often than directed. Do not stop taking this medicine suddenly except upon the advice of your doctor. Stopping this medicine too quickly may cause serious side effects or your condition may worsen. A special MedGuide will be given to you by the pharmacist with each prescription and refill. Be sure to read this information carefully each time. Talk to your pediatrician regarding the use of this medicine in children. While this drug may be prescribed for children as young as 7 years for selected conditions, precautions do apply. Overdosage: If you think you have taken too much of this medicine contact a poison control center or emergency room at once. NOTE: This medicine is only for you. Do not share this medicine with others. What if I miss a dose? If you miss a dose, skip the missed dose and go back to your regular dosing schedule. Do not take double or extra doses. What may interact with this medicine? Do not take fluoxetine with any of the following medications: -other medicines containing fluoxetine, like Sarafem or Symbyax -cisapride -linezolid -MAOIs like Carbex, Eldepryl, Marplan, Nardil, and Parnate -methylene blue (injected into a vein) -pimozide -thioridazine This medicine may also interact with the following medications: -alcohol -aspirin and aspirin-like medicines -carbamazepine -certain medicines for depression, anxiety, or psychotic disturbances -certain medicines for migraine headaches like almotriptan, eletriptan, frovatriptan, naratriptan, rizatriptan, sumatriptan, zolmitriptan -digoxin -diuretics -fentanyl -flecainide -furazolidone -isoniazid -lithium -medicines for sleep -medicines that  treat or prevent blood clots like warfarin, enoxaparin, and dalteparin -NSAIDs, medicines for pain and inflammation, like ibuprofen or naproxen -phenytoin -procarbazine -propafenone -rasagiline -ritonavir -supplements like St. John's wort, kava kava, valerian -tramadol -tryptophan -vinblastine This list may not describe all possible interactions. Give your health care provider a list of all the medicines, herbs, non-prescription drugs, or dietary supplements you use. Also tell them if you smoke, drink alcohol, or use illegal drugs. Some items may interact with your medicine. What should I watch for while using this medicine? Tell your doctor if your symptoms do not get better or if they get worse. Visit your doctor or health care professional for regular checks on your progress. Because it may take several weeks to see the full effects of this medicine, it is important to continue your treatment as prescribed by your doctor. Patients and their families should watch out for new or worsening thoughts of suicide or depression. Also watch out for sudden changes in feelings such as feeling anxious, agitated, panicky, irritable, hostile, aggressive, impulsive, severely restless, overly excited and hyperactive, or not being able to sleep. If this happens, especially at the beginning of treatment or after a change in dose, call your health care professional. Bonita Quin may get drowsy or dizzy. Do not drive, use machinery, or do anything that needs mental alertness until you know how this medicine affects you. Do not stand or sit up quickly, especially if you are an older patient. This reduces the risk of dizzy or fainting spells. Alcohol may interfere with the effect of this medicine. Avoid alcoholic drinks. Your mouth may get dry. Chewing sugarless gum or sucking hard candy, and drinking plenty of water may help. Contact your doctor if the problem does not go away  or is severe. This medicine may affect blood  sugar levels. If you have diabetes, check with your doctor or health care professional before you change your diet or the dose of your diabetic medicine. What side effects may I notice from receiving this medicine? Side effects that you should report to your doctor or health care professional as soon as possible: -allergic reactions like skin rash, itching or hives, swelling of the face, lips, or tongue -breathing problems -confusion -eye pain, changes in vision -fast or irregular heart rate, palpitations -flu-like fever, chills, cough, muscle or joint aches and pains -seizures -suicidal thoughts or other mood changes -swelling or redness in or around the eye -tremors -trouble sleeping -unusual bleeding or bruising -unusually tired or weak -vomiting Side effects that usually do not require medical attention (report to your doctor or health care professional if they continue or are bothersome): -change in sex drive or performance -diarrhea -dry mouth -flushing -headache -increased or decreased appetite -nausea -sweating This list may not describe all possible side effects. Call your doctor for medical advice about side effects. You may report side effects to FDA at 1-800-FDA-1088. Where should I keep my medicine? Keep out of the reach of children. Store at room temperature between 15 and 30 degrees C (59 and 86 degrees F). Throw away any unused medicine after the expiration date. NOTE: This sheet is a summary. It may not cover all possible information. If you have questions about this medicine, talk to your doctor, pharmacist, or health care provider.    2016, Elsevier/Gold Standard. (2014-04-11 12:40:07)

## 2015-07-21 NOTE — MAU Provider Note (Signed)
History     CSN: [id number]  Arrival date and time: 07/21/15 1509   First Provider Initiated Contact with Patient 07/21/15 1550         Chief Complaint  Patient presents with  . Vaginal Bleeding   HPI  Heather Webster is a 20 y.o. female who presents for vaginal bleeding. States she had a miscarriage in January & reports vaginal bleeding every day since then. States the blood alternates from dark red to brown but she normally bleeds enough to fill up 1 pad per day. Denies dizziness, headache, palpitations, chest pain, abdominal pain, or vaginal discharge.  Has had intercourse since the miscarriage. Is not using birth control or condoms. Is requesting STD testing.   After exam, patient requests medication for depression. States ever since this last miscarriage, she has been depressed every day. Denies SI/HI. States some days she doesn't want to get out of bed & it's affecting her work. Denies history of depression.   OB History    Gravida Para Term Preterm AB TAB SAB Ectopic Multiple Living   4    3  3          Past Medical History  Diagnosis Date  . Miscarriage     History reviewed. No pertinent past surgical history.  History reviewed. No pertinent family history.  Social History  Substance Use Topics  . Smoking status: Never Smoker   . Smokeless tobacco: None  . Alcohol Use: No    Allergies: No Known Allergies  Prescriptions prior to admission  Medication Sig Dispense Refill Last Dose  . naproxen (NAPROSYN) 375 MG tablet Take 1 tablet (375 mg total) by mouth 2 (two) times daily. 20 tablet 0   . oxyCODONE-acetaminophen (PERCOCET/ROXICET) 5-325 MG tablet Take 1-2 tablets by mouth every 6 (six) hours as needed for severe pain. 13 tablet 0   . Prenatal Vit-Fe Fumarate-FA (PRENATAL MULTIVITAMIN) TABS tablet Take 1 tablet by mouth daily at 12 noon.   05/10/2015    Review of Systems  Constitutional: Negative.   Cardiovascular: Negative for chest pain and palpitations.   Gastrointestinal: Negative.   Genitourinary: Negative for dysuria and urgency.       + vaginal bleeding + vaginal odor  Neurological: Negative for dizziness.  Psychiatric/Behavioral: Positive for depression. Negative for suicidal ideas, hallucinations, memory loss and substance abuse. The patient is not nervous/anxious and does not have insomnia.    Physical Exam   Blood pressure 123/80, pulse 102, temperature 98.2 F (36.8 C), temperature source Oral, resp. rate 16.  Physical Exam  Nursing note and vitals reviewed. Constitutional: She is oriented to person, place, and time. She appears well-developed and well-nourished. No distress.  HENT:  Head: Normocephalic and atraumatic.  Eyes: Conjunctivae are normal. Right eye exhibits no discharge. Left eye exhibits no discharge. No scleral icterus.  Neck: Normal range of motion.  Cardiovascular: Normal rate, regular rhythm and normal heart sounds.   No murmur heard. Respiratory: Effort normal and breath sounds normal. No respiratory distress. She has no wheezes.  GI: Soft. Bowel sounds are normal. She exhibits no distension. There is no tenderness.  Genitourinary: Uterus normal. Cervix exhibits no motion tenderness and no friability. No bleeding in the vagina. Vaginal discharge (small amount of yellow mucoid discharge, malodorous. no blood) found.  Blood staining on vulva; no blood internally  Neurological: She is alert and oriented to person, place, and time.  Skin: Skin is warm and dry. She is not diaphoretic.  Psychiatric: She has a normal  mood and affect. Her behavior is normal. Judgment and thought content normal.    MAU Course  Procedures Results for orders placed or performed during the hospital encounter of 07/21/15 (from the past 24 hour(s))  Urinalysis, Routine w reflex microscopic (not at [facility])     Status: Abnormal   Collection Time: 07/21/15  3:17 PM  Result Value Ref Range   Color, Urine YELLOW YELLOW   APPearance CLEAR  CLEAR   Specific Gravity, Urine 1.020 1.005 - 1.030   pH 5.5 5.0 - 8.0   Glucose, UA NEGATIVE NEGATIVE mg/dL   Hgb urine dipstick LARGE (A) NEGATIVE   Bilirubin Urine NEGATIVE NEGATIVE   Ketones, ur 15 (A) NEGATIVE mg/dL   Protein, ur NEGATIVE NEGATIVE mg/dL   Nitrite NEGATIVE NEGATIVE   Leukocytes, UA NEGATIVE NEGATIVE  Urine microscopic-add on     Status: Abnormal   Collection Time: 07/21/15  3:17 PM  Result Value Ref Range   Squamous Epithelial / LPF 0-5 (A) NONE SEEN   WBC, UA 0-5 0 - 5 WBC/hpf   RBC / HPF 6-30 0 - 5 RBC/hpf   Bacteria, UA FEW (A) NONE SEEN   Urine-Other MUCOUS PRESENT   Pregnancy, urine POC     Status: None   Collection Time: 07/21/15  3:43 PM  Result Value Ref Range   Preg Test, Ur NEGATIVE NEGATIVE  CBC     Status: None   Collection Time: 07/21/15  4:30 PM  Result Value Ref Range   WBC 4.5 4.0 - 10.5 K/uL   RBC 4.07 3.87 - 5.11 MIL/uL   Hemoglobin 12.5 12.0 - 15.0 g/dL   HCT 16.1 09.6 - 04.5 %   MCV 89.7 78.0 - 100.0 fL   MCH 30.7 26.0 - 34.0 pg   MCHC 34.2 30.0 - 36.0 g/dL   RDW 40.9 81.1 - 91.4 %   Platelets 315 150 - 400 K/uL  hCG, quantitative, pregnancy     Status: None   Collection Time: 07/21/15  4:30 PM  Result Value Ref Range   hCG, Beta Chain, Quant, S <1 <5 mIU/mL  Wet prep, genital     Status: Abnormal   Collection Time: 07/21/15  5:02 PM  Result Value Ref Range   Yeast Wet Prep HPF POC NONE SEEN NONE SEEN   Trich, Wet Prep NONE SEEN NONE SEEN   Clue Cells Wet Prep HPF POC PRESENT (A) NONE SEEN   WBC, Wet Prep HPF POC FEW (A) NONE SEEN   Sperm NONE SEEN     MDM UPT negative BHCG <1 Discussed therapy in combination with antidepressant. Patient is interested, will give info. Also informed her that she needs to find a local PCP who can manage her routine care & antidepressants.   Assessment and Plan  A: 1. Abnormal uterine bleeding (AUB)   2. Depression   3. BV (bacterial vaginosis)    P: Discharge home Rx sprintec, flagyl,  & zoloft Call [place] [facility] for appt Call [facility] for appt to manage antidepressant & routine care Call [facility] if bleeding doesn't improve on OCP No alcohol with flagyl RPR, HIV, GC/CT pending  Judeth Horn 07/21/2015, 3:41 PM

## 2015-07-21 NOTE — MAU Note (Addendum)
Patient presents with vaginal bleeding since January. Patient states she had a misccariage in January and has been bleeding on and off ever since.

## 2015-07-22 LAB — RPR: RPR Ser Ql: NONREACTIVE

## 2015-07-22 LAB — GC/CHLAMYDIA PROBE AMP (~~LOC~~) NOT AT ARMC
CHLAMYDIA, DNA PROBE: NEGATIVE
Neisseria Gonorrhea: NEGATIVE

## 2015-07-22 LAB — HIV ANTIBODY (ROUTINE TESTING W REFLEX): HIV SCREEN 4TH GENERATION: NONREACTIVE

## 2015-10-12 ENCOUNTER — Encounter (HOSPITAL_COMMUNITY): Payer: Self-pay

## 2015-10-12 ENCOUNTER — Inpatient Hospital Stay (HOSPITAL_COMMUNITY)
Admission: AD | Admit: 2015-10-12 | Discharge: 2015-10-13 | Disposition: A | Discharge status: Home / Self Care | Payer: BLUE CROSS/BLUE SHIELD | Source: Ambulatory Visit | Attending: Obstetrics and Gynecology | Admitting: Obstetrics and Gynecology

## 2015-10-12 DIAGNOSIS — O4691 Antepartum hemorrhage, unspecified, first trimester: Secondary | ICD-10-CM | POA: Diagnosis not present

## 2015-10-12 DIAGNOSIS — O039 Complete or unspecified spontaneous abortion without complication: Secondary | ICD-10-CM | POA: Diagnosis not present

## 2015-10-12 DIAGNOSIS — Z3A01 Less than 8 weeks gestation of pregnancy: Secondary | ICD-10-CM | POA: Diagnosis not present

## 2015-10-12 DIAGNOSIS — O209 Hemorrhage in early pregnancy, unspecified: Secondary | ICD-10-CM | POA: Diagnosis not present

## 2015-10-12 LAB — URINALYSIS, ROUTINE W REFLEX MICROSCOPIC
Bilirubin Urine: NEGATIVE
Glucose, UA: NEGATIVE mg/dL
Ketones, ur: NEGATIVE mg/dL
LEUKOCYTES UA: NEGATIVE
NITRITE: NEGATIVE
PH: 6 (ref 5.0–8.0)
Protein, ur: NEGATIVE mg/dL
SPECIFIC GRAVITY, URINE: 1.02 (ref 1.005–1.030)

## 2015-10-12 LAB — URINE MICROSCOPIC-ADD ON

## 2015-10-12 LAB — POCT PREGNANCY, URINE: PREG TEST UR: POSITIVE — AB

## 2015-10-12 NOTE — MAU Note (Signed)
Pt presents complaining of spotting when she wipes. +HPT in May but unsure LMP. Has not had period since miscarriage in January. Denies pain at this time. Had some cramping before.

## 2015-10-12 NOTE — MAU Provider Note (Signed)
History   [id number]   Chief Complaint  Patient presents with  . Vaginal Bleeding    HPI Heather Webster is a 20 y.o. female  G4P0030 at unknown gestation due to irregular menses here with report of spotting of blood with wiping.  Miscarriage in January 2017.  No report of pelvic pain at this time.  Pt declines screening for STI's.   No LMP recorded (lmp unknown). Patient is not currently having periods (Reason: Needs Pregnancy Test).  OB History  Gravida Para Term Preterm AB SAB TAB Ectopic Multiple Living  4    3 3         # Outcome Date GA Lbr Len/2nd Weight Sex Delivery Anes PTL Lv  4 Gravida           3 SAB           2 SAB           1 SAB               Past Medical History  Diagnosis Date  . Miscarriage     No family history on file.  Social History   Social History  . Marital Status: Single    Spouse Name: N/A  . Number of Children: N/A  . Years of Education: N/A   Social History Main Topics  . Smoking status: Never Smoker   . Smokeless tobacco: None  . Alcohol Use: No  . Drug Use: No  . Sexual Activity: Yes    Birth Control/ Protection: None   Other Topics Concern  . None   Social History Narrative    Allergies  Allergen Reactions  . Latex Hives and Itching    No current facility-administered medications on file prior to encounter.   Current Outpatient Prescriptions on File Prior to Encounter  Medication Sig Dispense Refill  . acetaminophen (TYLENOL) 500 MG tablet Take 500 mg by mouth every 6 (six) hours as needed.    . Prenatal Vit-Fe Fumarate-FA (PRENATAL MULTIVITAMIN) TABS tablet Take 1 tablet by mouth daily at 12 noon.    Marland Kitchen. albuterol (PROVENTIL HFA;VENTOLIN HFA) 108 (90 Base) MCG/ACT inhaler Inhale 2 puffs into the lungs every 6 (six) hours as needed for wheezing or shortness of breath.    . metroNIDAZOLE (FLAGYL) 500 MG tablet Take 1 tablet (500 mg total) by mouth 2 (two) times daily. 14 tablet 0  . naproxen (NAPROSYN) 375 MG tablet Take 1  tablet (375 mg total) by mouth 2 (two) times daily. (Patient not taking: Reported on 07/21/2015) 20 tablet 0  . norgestimate-ethinyl estradiol (SPRINTEC 28) 0.25-35 MG-MCG tablet Take 1 tablet by mouth daily. 1 Package 11  . sertraline (ZOLOFT) 50 MG tablet Take 1 tablet (50 mg total) by mouth daily. 30 tablet 0     Review of Systems  Constitutional: Negative for fever and chills.  Genitourinary: Positive for vaginal bleeding. Negative for pelvic pain.     Physical Exam   Filed Vitals:   10/12/15 2332  BP: 134/81  Pulse: 102  Temp: 98.3 F (36.8 C)  TempSrc: Oral  Resp: 18    Physical Exam  Constitutional: She is oriented to person, place, and time. She appears well-developed and well-nourished. No distress.  HENT:  Head: Normocephalic.  Neck: Normal range of motion. Neck supple.  Cardiovascular: Normal rate, regular rhythm and normal heart sounds.   Respiratory: Effort normal and breath sounds normal. No respiratory distress.  GI: Soft. She exhibits no mass. There is no tenderness. There  is no rebound and no guarding.  Genitourinary: Uterus is enlarged. Right adnexum displays no mass, no tenderness and no fullness. Left adnexum displays no mass, no tenderness and no fullness. No bleeding in the vagina.  Musculoskeletal: Normal range of motion.  Neurological: She is alert and oriented to person, place, and time.  Skin: Skin is warm and dry.    MAU Course  Procedures  MDM Results for orders placed or performed during the hospital encounter of 10/12/15 (from the past 24 hour(s))  Urinalysis, Routine w reflex microscopic (not at [facility])     Status: Abnormal   Collection Time: 10/12/15 11:24 PM  Result Value Ref Range   Color, Urine YELLOW YELLOW   APPearance CLEAR CLEAR   Specific Gravity, Urine 1.020 1.005 - 1.030   pH 6.0 5.0 - 8.0   Glucose, UA NEGATIVE NEGATIVE mg/dL   Hgb urine dipstick MODERATE (A) NEGATIVE   Bilirubin Urine NEGATIVE NEGATIVE   Ketones, ur  NEGATIVE NEGATIVE mg/dL   Protein, ur NEGATIVE NEGATIVE mg/dL   Nitrite NEGATIVE NEGATIVE   Leukocytes, UA NEGATIVE NEGATIVE  Urine microscopic-add on     Status: Abnormal   Collection Time: 10/12/15 11:24 PM  Result Value Ref Range   Squamous Epithelial / LPF 0-5 (A) NONE SEEN   WBC, UA 0-5 0 - 5 WBC/hpf   RBC / HPF 6-30 0 - 5 RBC/hpf   Bacteria, UA FEW (A) NONE SEEN  Pregnancy, urine POC     Status: Abnormal   Collection Time: 10/12/15 11:41 PM  Result Value Ref Range   Preg Test, Ur POSITIVE (A) NEGATIVE  CBC     Status: Abnormal   Collection Time: 10/13/15 12:24 AM  Result Value Ref Range   WBC 3.8 (L) 4.0 - 10.5 K/uL   RBC 3.64 (L) 3.87 - 5.11 MIL/uL   Hemoglobin 10.9 (L) 12.0 - 15.0 g/dL   HCT 56.2 (L) 13.0 - 86.5 %   MCV 87.1 78.0 - 100.0 fL   MCH 29.9 26.0 - 34.0 pg   MCHC 34.4 30.0 - 36.0 g/dL   RDW 78.4 69.6 - 29.5 %   Platelets 312 150 - 400 K/uL  hCG, quantitative, pregnancy     Status: Abnormal   Collection Time: 10/13/15 12:24 AM  Result Value Ref Range   hCG, Beta Chain, Quant, S 6719 (H) <5 mIU/mL   Ultrasound: FINDINGS: Intrauterine gestational sac: A single intrauterine gestational sac is visualized.  Yolk sac: Yolk sac is identified with limited visualization.  Embryo: Fetal pole is identified with limited visualization.  Cardiac Activity: None identified.  CRL: 7.8 mm 6 w 5 d [place] EDC: 06/02/2016  Subchorionic hemorrhage: None visualized.  Maternal uterus/adnexae: Uterus is retroverted. No myometrial mass lesions. Both ovaries are visualized. Corpus luteal cyst on the right. No abnormal adnexal masses. No free fluid in the pelvis.  IMPRESSION: A single intrauterine pregnancy is identified. Estimated gestational age by crown-rump length is 6 weeks 5 days. No fetal motion or cardiac activity are observed. Findings are suspicious but not yet definitive for failed pregnancy. Recommend follow-up US in 10-14 days for  definitive diagnosis. This recommendation follows SRU consensus guidelines: Diagnostic Criteria for Nonviable Pregnancy Early in the First Trimester. Malva Limes Med 2013; 284:1324-40.  Assessment and Plan  20 y.o. G4P0030 at 6.5 wks IUP  ? Fetal demise per [place] report  (highly likely due to CRL > 7 mm an no cardiac activity)  Plan: Discharge to home Repeat ultrasound in 7  days Bleeding precautions Advised testing in future for coagulopathies due to >3 miscarriages  Marlis Edelson, CNM 10/13/2015 1:53 AM

## 2015-10-13 ENCOUNTER — Inpatient Hospital Stay (HOSPITAL_COMMUNITY): Payer: BLUE CROSS/BLUE SHIELD

## 2015-10-13 ENCOUNTER — Encounter (HOSPITAL_COMMUNITY): Payer: Self-pay | Admitting: Radiology

## 2015-10-13 ENCOUNTER — Encounter (HOSPITAL_COMMUNITY): Payer: Self-pay | Admitting: *Deleted

## 2015-10-13 ENCOUNTER — Inpatient Hospital Stay (EMERGENCY_DEPARTMENT_HOSPITAL)
Admission: AD | Admit: 2015-10-13 | Discharge: 2015-10-13 | Disposition: A | Discharge status: Home / Self Care | Payer: BLUE CROSS/BLUE SHIELD | Source: Ambulatory Visit | Attending: Obstetrics & Gynecology | Admitting: Obstetrics & Gynecology

## 2015-10-13 DIAGNOSIS — N939 Abnormal uterine and vaginal bleeding, unspecified: Secondary | ICD-10-CM

## 2015-10-13 DIAGNOSIS — Z3A00 Weeks of gestation of pregnancy not specified: Secondary | ICD-10-CM

## 2015-10-13 DIAGNOSIS — Z91040 Latex allergy status: Secondary | ICD-10-CM | POA: Insufficient documentation

## 2015-10-13 DIAGNOSIS — O039 Complete or unspecified spontaneous abortion without complication: Secondary | ICD-10-CM | POA: Diagnosis not present

## 2015-10-13 DIAGNOSIS — R102 Pelvic and perineal pain: Secondary | ICD-10-CM | POA: Insufficient documentation

## 2015-10-13 DIAGNOSIS — O4691 Antepartum hemorrhage, unspecified, first trimester: Secondary | ICD-10-CM

## 2015-10-13 LAB — CBC
HCT: 31.7 % — ABNORMAL LOW (ref 36.0–46.0)
HEMATOCRIT: 32.5 % — AB (ref 36.0–46.0)
HEMOGLOBIN: 10.9 g/dL — AB (ref 12.0–15.0)
Hemoglobin: 11.2 g/dL — ABNORMAL LOW (ref 12.0–15.0)
MCH: 29.9 pg (ref 26.0–34.0)
MCH: 30.2 pg (ref 26.0–34.0)
MCHC: 34.4 g/dL (ref 30.0–36.0)
MCHC: 34.5 g/dL (ref 30.0–36.0)
MCV: 87.1 fL (ref 78.0–100.0)
MCV: 87.6 fL (ref 78.0–100.0)
PLATELETS: 312 10*3/uL (ref 150–400)
PLATELETS: 368 10*3/uL (ref 150–400)
RBC: 3.64 MIL/uL — ABNORMAL LOW (ref 3.87–5.11)
RBC: 3.71 MIL/uL — ABNORMAL LOW (ref 3.87–5.11)
RDW: 14.2 % (ref 11.5–15.5)
RDW: 14.3 % (ref 11.5–15.5)
WBC: 3.8 10*3/uL — ABNORMAL LOW (ref 4.0–10.5)
WBC: 4.6 10*3/uL (ref 4.0–10.5)

## 2015-10-13 LAB — HCG, QUANTITATIVE, PREGNANCY
HCG, BETA CHAIN, QUANT, S: 6719 m[IU]/mL — AB (ref <5)
hCG, Beta Chain, Quant, S: 4771 m[IU]/mL — ABNORMAL HIGH (ref <5)

## 2015-10-13 MED ORDER — IBUPROFEN 800 MG PO TABS
800.0000 mg | ORAL_TABLET | Freq: Three times a day (TID) | ORAL | Status: AC | PRN
Start: 2015-10-13 — End: ?

## 2015-10-13 MED ORDER — HYDROCODONE-ACETAMINOPHEN 5-325 MG PO TABS
2.0000 | ORAL_TABLET | Freq: Once | ORAL | Status: AC
  Administered 2015-10-13: 2 via ORAL
  Filled 2015-10-13: qty 2

## 2015-10-13 NOTE — MAU Note (Signed)
PT SAYS SHE WAS HERE YESTERDAY-  FOR SPOTTING-    LABS  AND U/S.        THEN  TODAY   AT  6PM-    SHE WAS LAYING ON BED-  SAW  RED  BLOOD ON   SHEET.    HAD  CRAMPS  AT 6PM-   BUT  NOW  NO CRAMPS.   NO PAD ON , NOR  UNDERWEAR-   HAS  RED BLOOD  ON BLACK PANTS  AND  THIGHS. -  .

## 2015-10-13 NOTE — Discharge Instructions (Signed)

## 2015-10-13 NOTE — MAU Note (Signed)
Urine in lab 

## 2015-10-13 NOTE — MAU Provider Note (Signed)
History     CSN: [id number]  Arrival date and time: 10/13/15 [id number]   First Provider Initiated Contact with Patient 10/13/15 2007      Chief Complaint  Patient presents with  . Vaginal Bleeding  . Pelvic Pain   Vaginal Bleeding The patient's primary symptoms include pelvic pain and vaginal bleeding. This is a new problem. The current episode started today. The problem occurs intermittently. The problem has been gradually worsening. Pain severity now: 9/10  The problem affects both sides. She is pregnant. Associated symptoms include abdominal pain. Pertinent negatives include no chills, constipation, diarrhea, dysuria, fever, frequency, nausea, urgency or vomiting. The vaginal bleeding is typical of menses. She has not been passing clots. She has not been passing tissue. Nothing aggravates the symptoms. Menstrual history: LMP 09/02/15    Past Medical History  Diagnosis Date  . Miscarriage     History reviewed. No pertinent past surgical history.  History reviewed. No pertinent family history.  Social History  Substance Use Topics  . Smoking status: Never Smoker   . Smokeless tobacco: None  . Alcohol Use: No    Allergies:  Allergies  Allergen Reactions  . Latex Hives and Itching    Prescriptions prior to admission  Medication Sig Dispense Refill Last Dose  . acetaminophen (TYLENOL) 500 MG tablet Take 500 mg by mouth every 6 (six) hours as needed.   10/11/2015 at Unknown time  . albuterol (PROVENTIL HFA;VENTOLIN HFA) 108 (90 Base) MCG/ACT inhaler Inhale 2 puffs into the lungs every 6 (six) hours as needed for wheezing or shortness of breath.   Unknown at Unknown time  . Prenatal Vit-Fe Fumarate-FA (PRENATAL MULTIVITAMIN) TABS tablet Take 1 tablet by mouth daily at 12 noon.   10/12/2015 at Unknown time  . sertraline (ZOLOFT) 50 MG tablet Take 1 tablet (50 mg total) by mouth daily. 30 tablet 0 Unknown at Unknown time    Review of Systems  Constitutional: Negative for fever and  chills.  Gastrointestinal: Positive for abdominal pain. Negative for nausea, vomiting, diarrhea and constipation.  Genitourinary: Positive for vaginal bleeding and pelvic pain. Negative for dysuria, urgency and frequency.   Physical Exam   Blood pressure 113/73, pulse 94, temperature 98.3 F (36.8 C), temperature source Oral, resp. rate 20, height 4\' 11"  (1.499 m), weight 43.432 kg (95 lb 12 oz), last menstrual period 09/02/2015, unknown if currently breastfeeding.  Physical Exam  Nursing note and vitals reviewed. Constitutional: She is oriented to person, place, and time. She appears well-developed and well-nourished. No distress (appears uncomfortable ).  HENT:  Head: Normocephalic.  Cardiovascular: Normal rate.   Respiratory: Effort normal.  GI: Soft. There is no tenderness.  Genitourinary:   External: no lesion Vagina: small amount of blood seen  Cervix: pink, smooth, tissue at the cervical os. Removed easily. Sent to pathology.  Uterus: NSSC Adnexa: NT   Musculoskeletal: Normal range of motion.  Neurological: She is alert and oriented to person, place, and time.  Skin: Skin is warm and dry.  Psychiatric: She has a normal mood and affect.   Results for Sharrell KuMCLEAN, Yareni (MRN [id number]) as of 10/13/2015 21:42  Ref. Range 10/13/2015 00:24 10/13/2015 01:10 10/13/2015 20:36  HCG, Beta Chain, Quant, S Latest Ref Range: <5 mIU/mL 6719 (H)  4771 (H)   MAU Course  Procedures  MDM   Assessment and Plan   1. SAB (spontaneous abortion)    DC home Comfort measures reviewed  Bleeding precautions Pathology pending  RX: ibuprofen 800 mg PRN #  30  Return to MAU as needed Consider coagulopathy work up for >3 sab   Follow-up Information    Follow up with [facility].   Specialty:  Obstetrics and Gynecology   Why:  They will call you with an appointment   Contact information:   [address] [place] [address] [phone]        Tawnya Crook 10/13/2015, 8:09 PM

## 2015-10-13 NOTE — Discharge Instructions (Signed)
Miscarriage  A miscarriage is the sudden loss of an unborn baby (fetus) before the 20th week of pregnancy. Most miscarriages happen in the first 3 months of pregnancy. Sometimes, it happens before a woman even knows she is pregnant. A miscarriage is also called a "spontaneous miscarriage" or "early pregnancy loss." Having a miscarriage can be an emotional experience. Talk with your caregiver about any questions you may have about miscarrying, the grieving process, and your future pregnancy plans.  CAUSES    Problems with the fetal chromosomes that make it impossible for the baby to develop normally. Problems with the baby's genes or chromosomes are most often the result of errors that occur, by chance, as the embryo divides and grows. The problems are not inherited from the parents.   Infection of the cervix or uterus.    Hormone problems.    Problems with the cervix, such as having an incompetent cervix. This is when the tissue in the cervix is not strong enough to hold the pregnancy.    Problems with the uterus, such as an abnormally shaped uterus, uterine fibroids, or congenital abnormalities.    Certain medical conditions.    Smoking, drinking alcohol, or taking illegal drugs.    Trauma.   Often, the cause of a miscarriage is unknown.   SYMPTOMS    Vaginal bleeding or spotting, with or without cramps or pain.   Pain or cramping in the abdomen or lower back.   Passing fluid, tissue, or blood clots from the vagina.  DIAGNOSIS   Your caregiver will perform a physical exam. You may also have an ultrasound to confirm the miscarriage. Blood or urine tests may also be ordered.  TREATMENT    Sometimes, treatment is not necessary if you naturally pass all the fetal tissue that was in the uterus. If some of the fetus or placenta remains in the body (incomplete miscarriage), tissue left behind may become infected and must be removed. Usually, a dilation and curettage (D and C) procedure is performed.  During a D and C procedure, the cervix is widened (dilated) and any remaining fetal or placental tissue is gently removed from the uterus.   Antibiotic medicines are prescribed if there is an infection. Other medicines may be given to reduce the size of the uterus (contract) if there is a lot of bleeding.   If you have Rh negative blood and your baby was Rh positive, you will need a Rh immunoglobulin shot. This shot will protect any future baby from having Rh blood problems in future pregnancies.  HOME CARE INSTRUCTIONS    Your caregiver may order bed rest or may allow you to continue light activity. Resume activity as directed by your caregiver.   Have someone help with home and family responsibilities during this time.    Keep track of the number of sanitary pads you use each day and how soaked (saturated) they are. Write down this information.    Do not use tampons. Do not douche or have sexual intercourse until approved by your caregiver.    Only take over-the-counter or prescription medicines for pain or discomfort as directed by your caregiver.    Do not take aspirin. Aspirin can cause bleeding.    Keep all follow-up appointments with your caregiver.    If you or your partner have problems with grieving, talk to your caregiver or seek counseling to help cope with the pregnancy loss. Allow enough time to grieve before trying to get pregnant again.     SEEK IMMEDIATE MEDICAL CARE IF:    You have severe cramps or pain in your back or abdomen.   You have a fever.   You pass large blood clots (walnut-sized or larger) ortissue from your vagina. Save any tissue for your caregiver to inspect.    Your bleeding increases.    You have a thick, bad-smelling vaginal discharge.   You become lightheaded, weak, or you faint.    You have chills.   MAKE SURE YOU:   Understand these instructions.   Will watch your condition.   Will get help right away if you are not doing well or get worse.     This  information is not intended to replace advice given to you by your health care provider. Make sure you discuss any questions you have with your health care provider.     Document Released: 10/12/2000 Document Revised: 08/13/2012 Document Reviewed: 06/07/2011  Elsevier Interactive Patient Education 2016 [employer]

## 2015-10-20 ENCOUNTER — Ambulatory Visit (HOSPITAL_COMMUNITY): Admission: RE | Admit: 2015-10-20 | Payer: BLUE CROSS/BLUE SHIELD | Source: Ambulatory Visit

## 2015-10-20 ENCOUNTER — Encounter: Payer: BLUE CROSS/BLUE SHIELD | Admitting: Family Medicine

## 2015-11-11 ENCOUNTER — Encounter: Payer: BLUE CROSS/BLUE SHIELD | Admitting: Certified Nurse Midwife

## 2016-08-17 ENCOUNTER — Encounter (HOSPITAL_COMMUNITY): Payer: Self-pay

## 2017-07-08 IMAGING — US US OB TRANSVAGINAL
1 series · 13 of 28 positions shown · non-contrast
Comparison: None.

CLINICAL DATA: Pregnant patient with vaginal bleeding and
right-sided abdominal pain. Beta HCG 78. Rule out ectopic or
torsion. Patient reports approximately 11 weeks pregnant.



[Series 1: us ob transvaginal · 0.17mm/px · 13 of 52 slices shown]
[im 2/52]
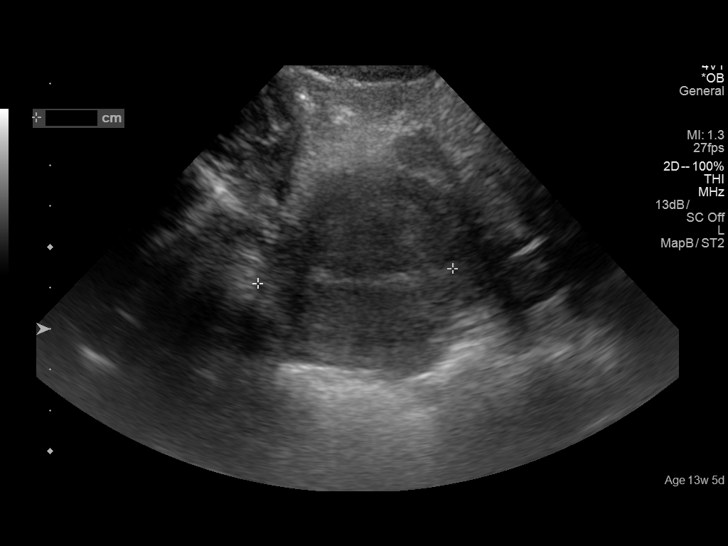
[im 6/52]
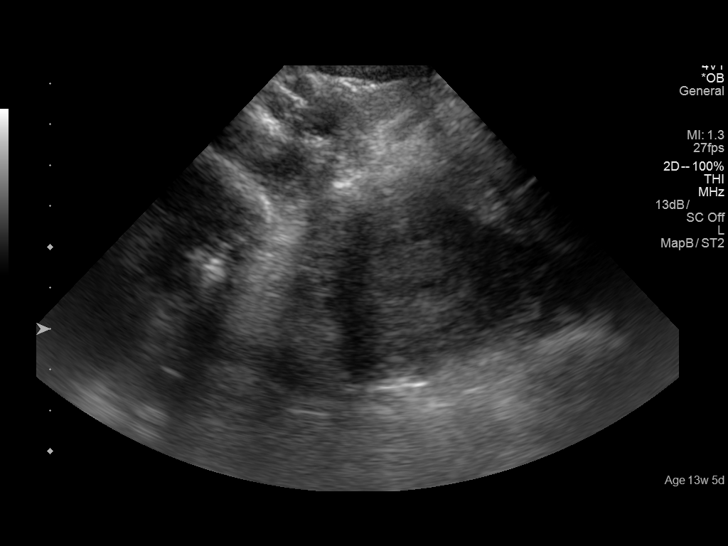
[im 10/52]
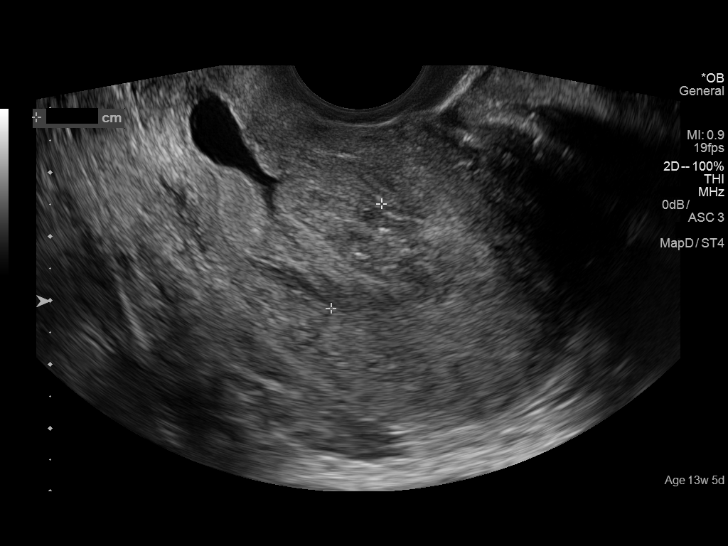
[im 14/52]
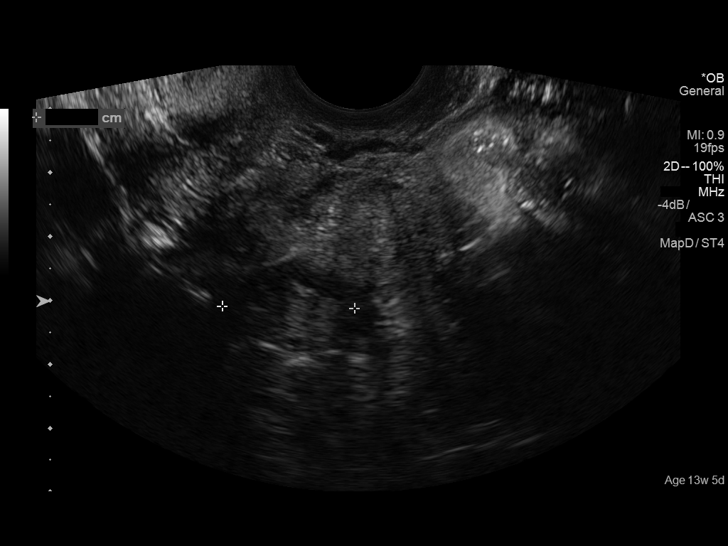
[im 18/52]
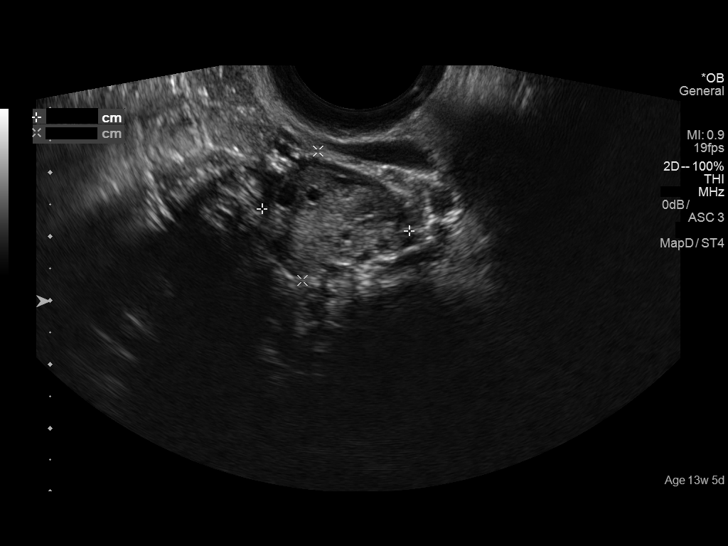
[im 21/52]
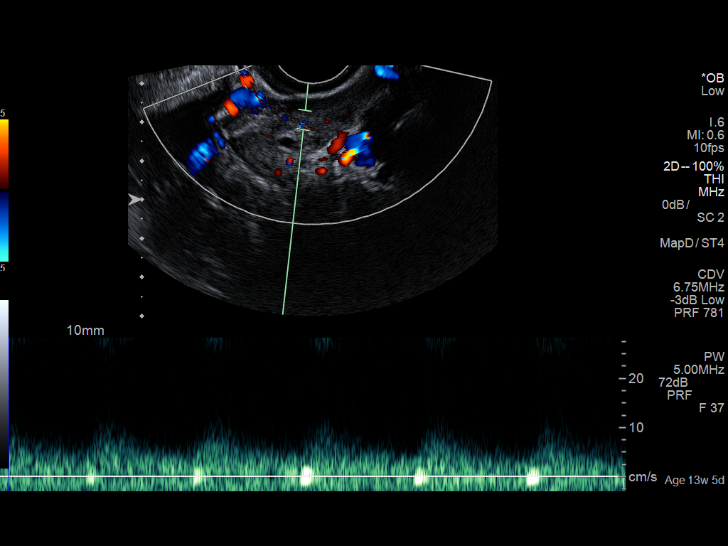
[im 27/52]
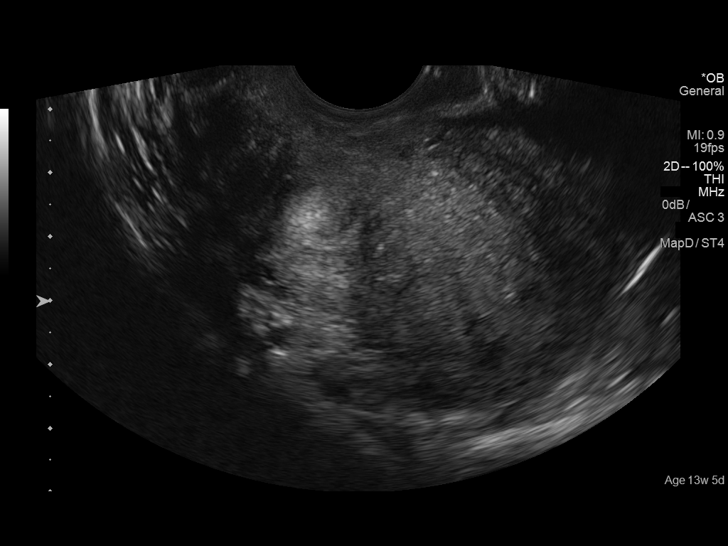
[im 31/52]
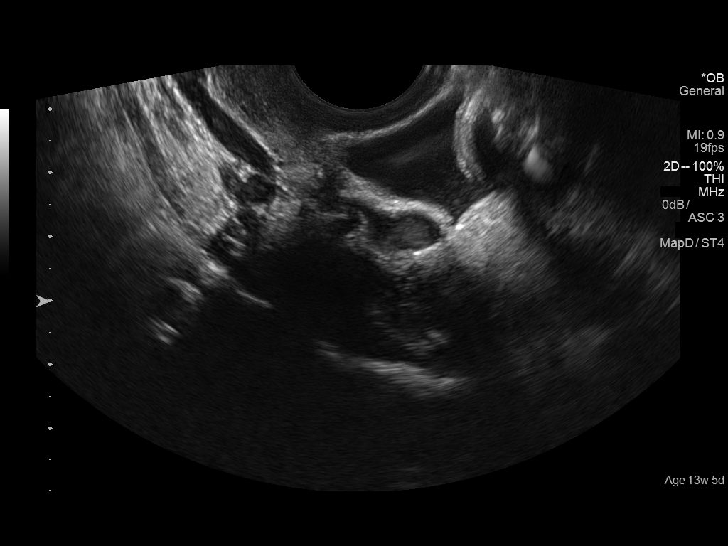
[im 35/52]
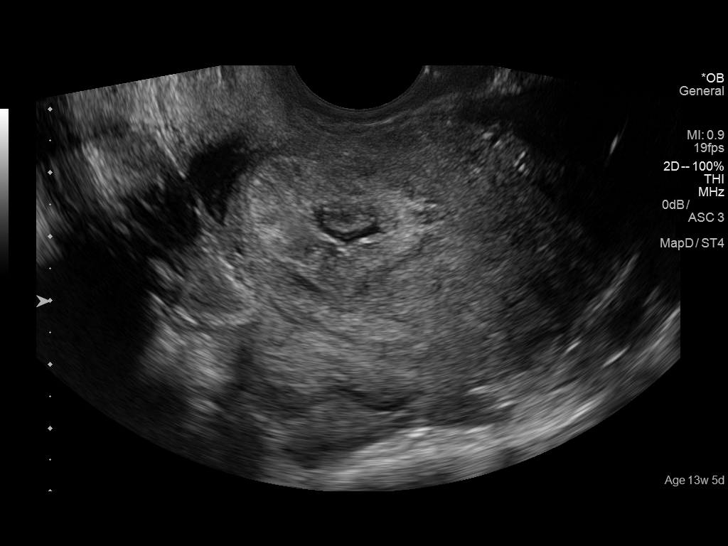
[im 38/52]
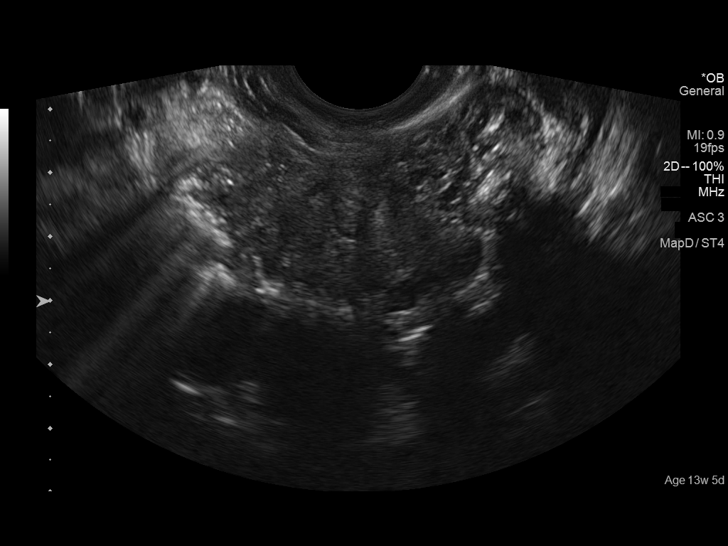
[im 42/52]
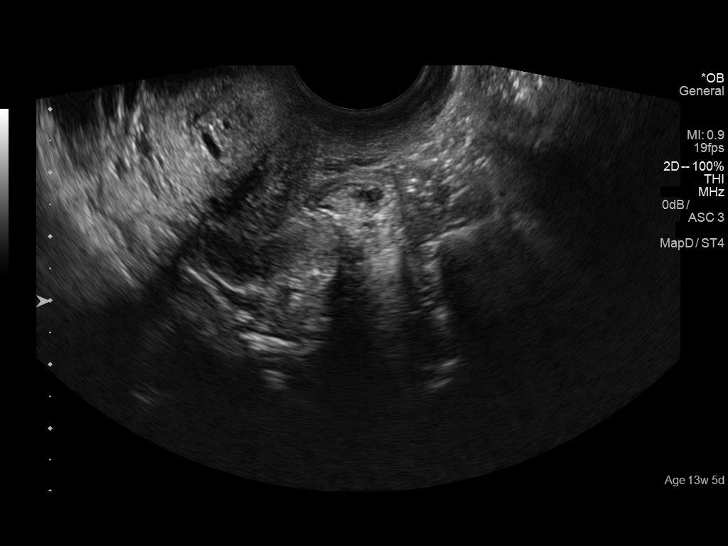
[im 46/52]
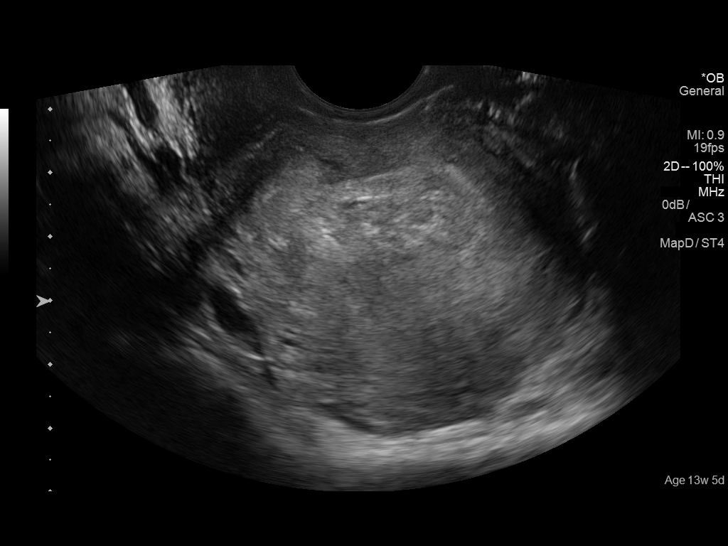
[im 50/52]
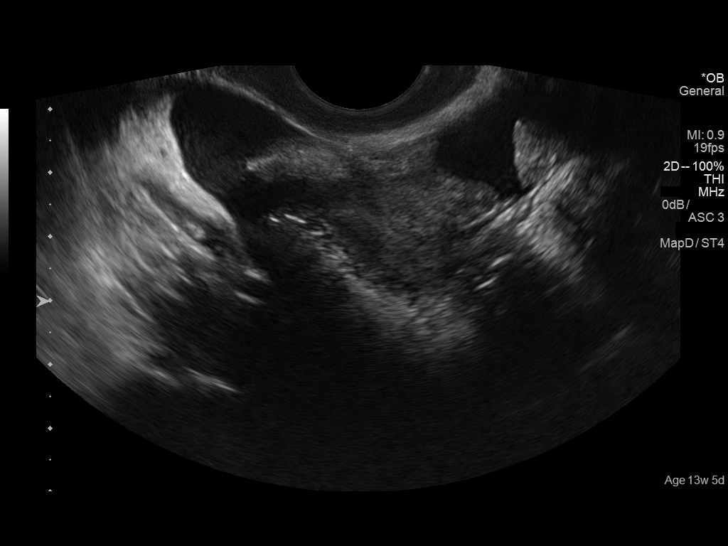

[13 of 28 positions shown; findings below may reference images not displayed]

FINDINGS: Intrauterine gestational sac: Not present.

Yolk sac:  Not present.

Embryo:  Not present.

Maternal uterus/adnexae: The uterus is retroverted measuring 7.7 x
5.1 x 4.8 cm. There is no intrauterine gestational sac. Diffuse
thickening of the endometrium measuring up to 1.8 cm containing
heterogeneous echogenicity and small amount of fluid. The right
ovary appears normal measuring 2.9 x 0.8 x 2.1 cm. Blood flow seen.
No right adnexal mass. The left ovary appears normal measuring 2.3 x
2.0 x 2.9 cm with blood flow. No left adnexal mass. Trace pelvic
free fluid.

Pulsed Doppler evaluation of both ovaries demonstrates normal
appearing low-resistance arterial and venous waveforms.
IMPRESSION: 1. No intrauterine gestation. No findings to suggest ectopic
pregnancy. Heterogeneous thickened endometrium, may reflect failed
pregnancy versus less likely very early intrauterine pregnancy given
the degree of heterogeneity and last menstrual period. Recommend
continued trending of beta HCG, and sonographic follow-up as
indicated.
2. Normal appearance of both ovaries. Normal blood flow. No ovarian
torsion, particularly on the right.

## 2017-10-20 IMAGING — US US OB COMP LESS 14 WK
1 series · 15 of 28 positions shown · non-contrast
Comparison: None.

CLINICAL DATA: Spotting. Unsure LMP. Quantitative beta HCG is
pending.

EXAM:
OBSTETRIC <14 WK US AND TRANSVAGINAL OB US
TECHNIQUE: Both transabdominal and transvaginal ultrasound examinations were
performed for complete evaluation of the gestation as well as the
maternal uterus, adnexal regions, and pelvic cul-de-sac.
Transvaginal technique was performed to assess early pregnancy.

[Series 1: us ob comp less 14 wk · 15 of 67 slices shown]
[im 1/67]
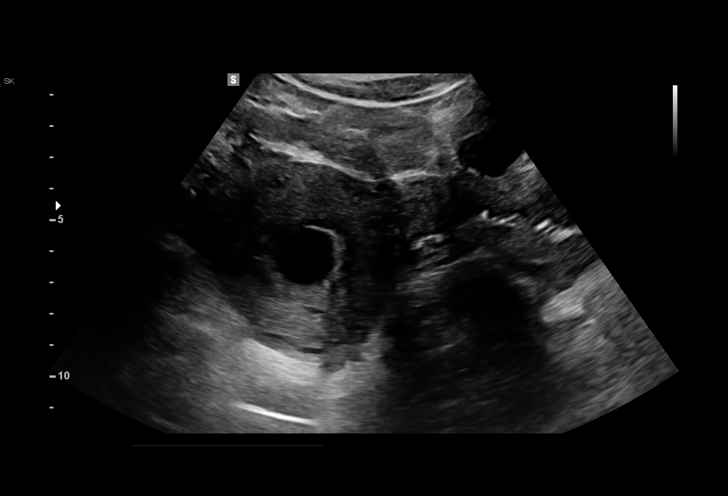
[im 5/67]
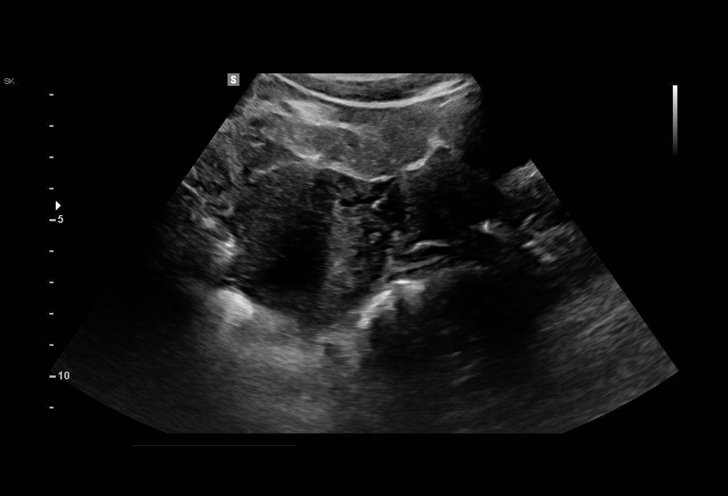
[im 10/67]
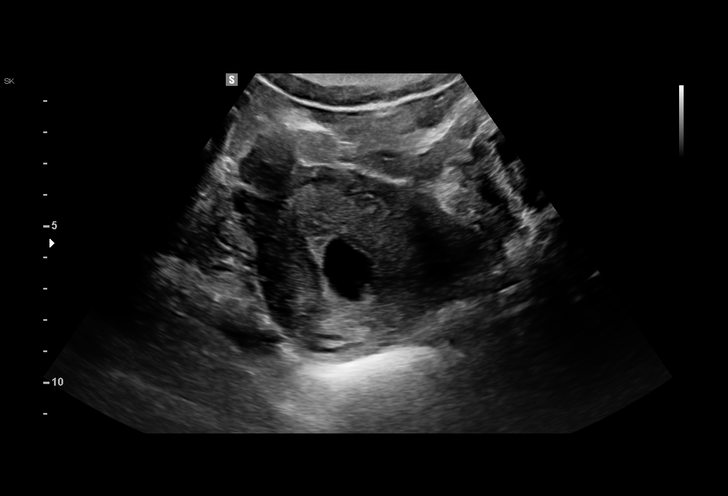
[im 15/67]
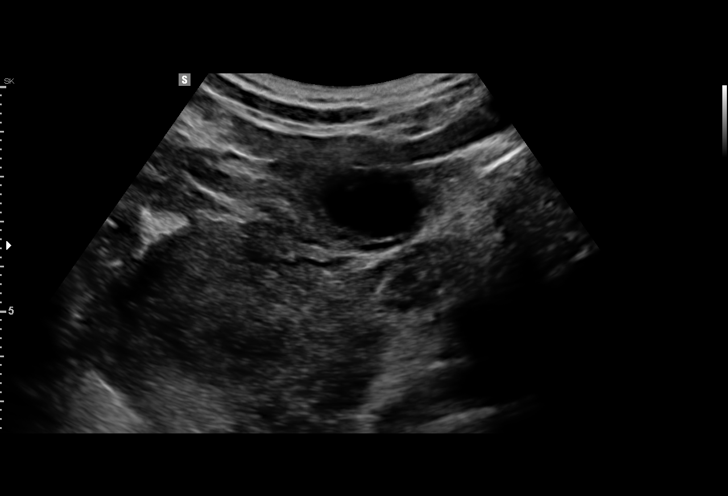
[im 20/67]
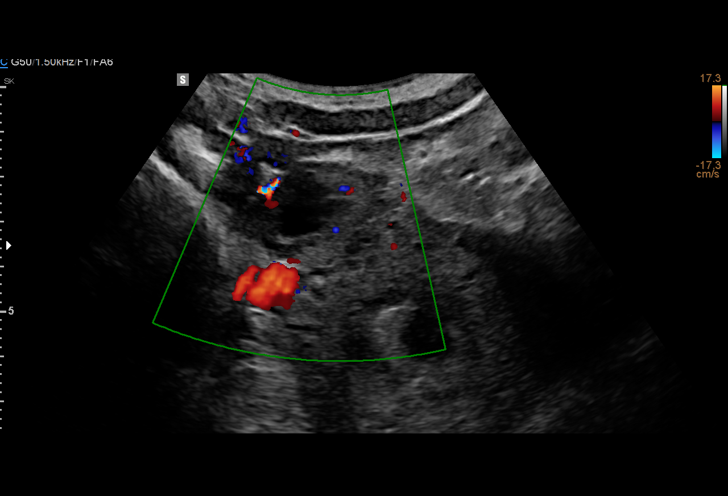
[im 25/67]
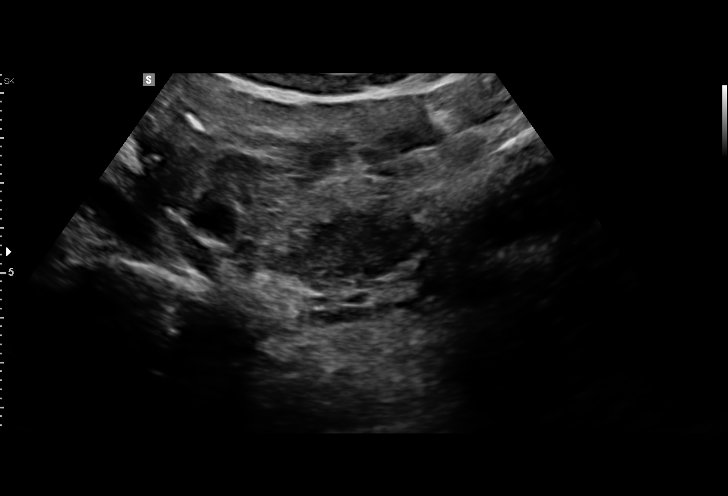
[im 30/67]
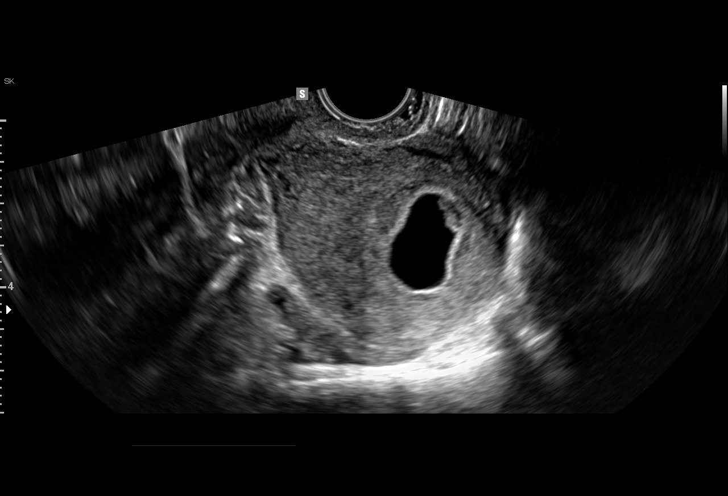
[im 35/67]
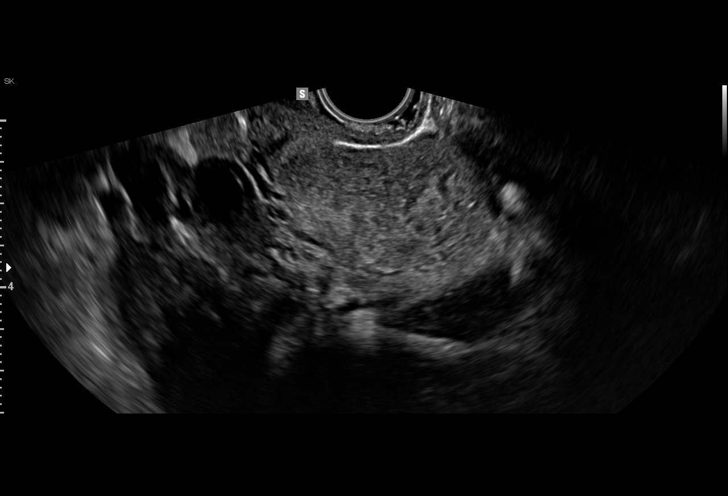
[im 37/67]
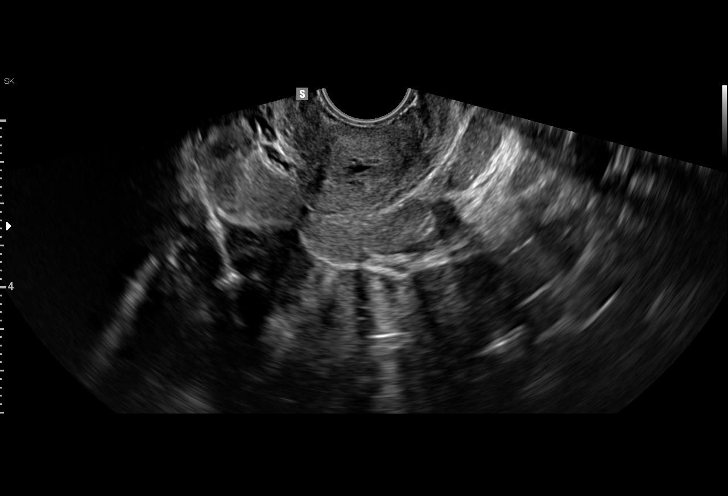
[im 42/67]
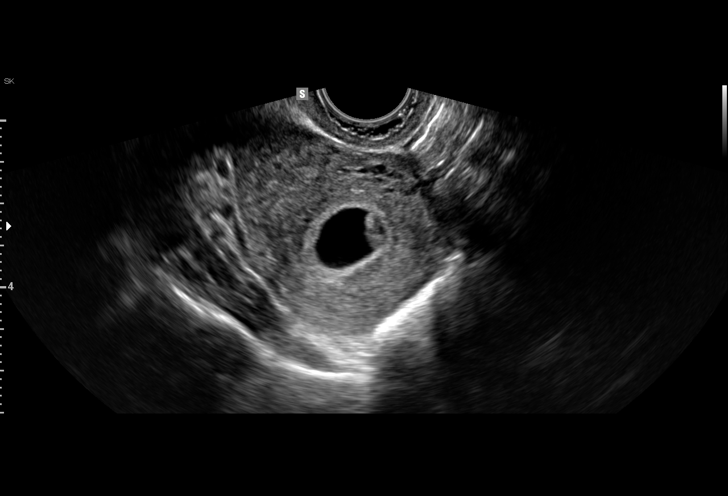
[im 47/67]
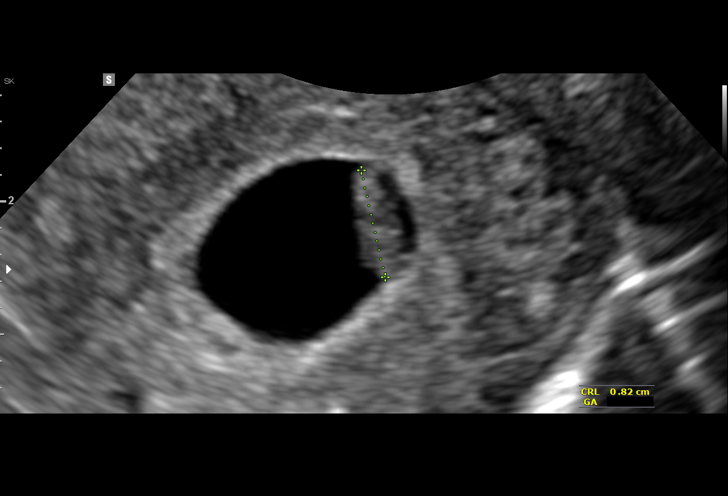
[im 52/67]
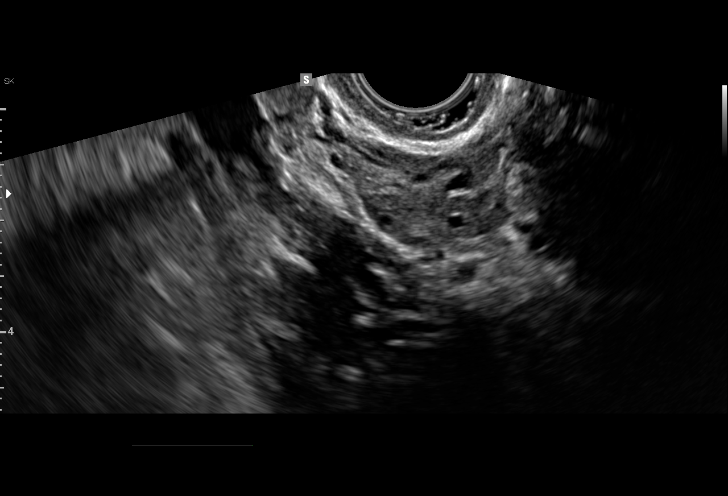
[im 57/67]
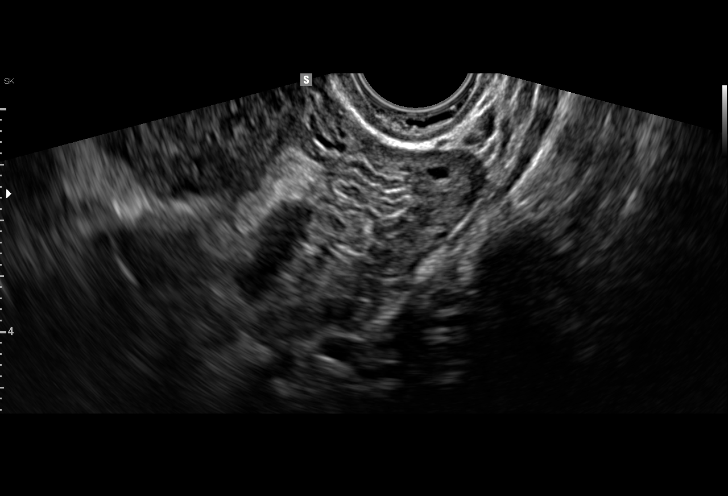
[im 62/67]
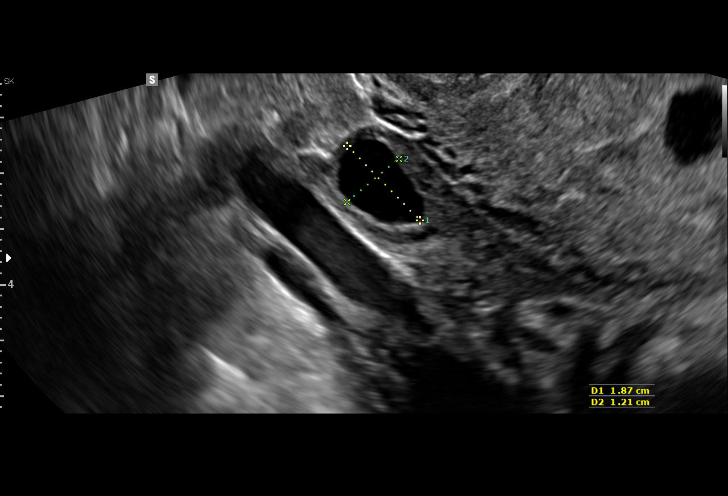
[im 67/67]
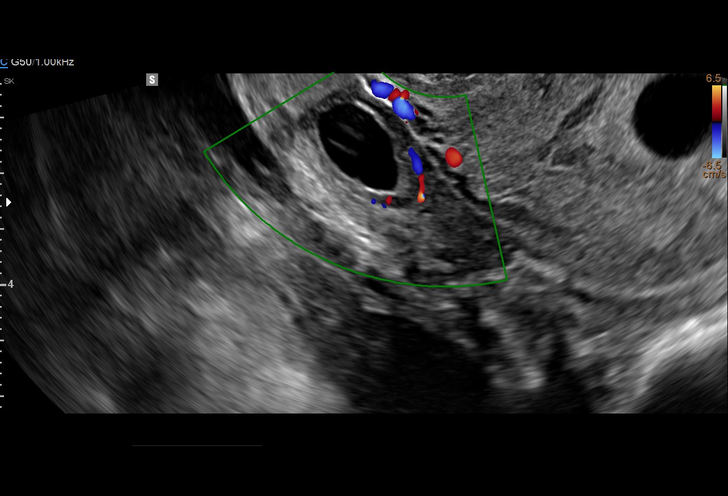

[15 of 28 positions shown; findings below may reference images not displayed]

FINDINGS: Intrauterine gestational sac: A single intrauterine gestational sac
is visualized.

Yolk sac:  Yolk sac is identified with limited visualization.

Embryo:  Fetal pole is identified with limited visualization.

Cardiac Activity: None identified.

CRL:  7.8  mm   6 w   5 d                  US EDC: 06/02/2016

Subchorionic hemorrhage:  None visualized.

Maternal uterus/adnexae: Uterus is retroverted. No myometrial mass
lesions. Both ovaries are visualized. Corpus luteal cyst on the
right. No abnormal adnexal masses. No free fluid in the pelvis.
IMPRESSION: A single intrauterine pregnancy is identified. Estimated gestational
age by crown-rump length is 6 weeks 5 days. No fetal motion or
cardiac activity are observed. Findings are suspicious but not yet
definitive for failed pregnancy. Recommend follow-up US in 10-14
days for definitive diagnosis. This recommendation follows SRU
consensus guidelines: Diagnostic Criteria for Nonviable Pregnancy
Early in the First Trimester. N Engl J Med 3380; [DATE].

## 2018-03-12 NOTE — ED Notes (Signed)
ED Notes               Saline lock to right upper arm dc'd with cathlon intact. Occlusive dressing applied at this time and pt tolerated procedure well.  [employer]

## 2018-03-12 NOTE — Discharge Summary (Signed)
ED Clinical Summary                         [facility]  [address]  [place], [place] [address]  [phone]           PERSON INFORMATION  Name: Heather, HOW Age:  22 Years DOB: [birth year]   Sex: Female Language: English PCP: Grier Mitts   Marital Status:  Single Phone: [phone] Med Service: MED-Medicine   MRN:  [id number] Acct# [id number] Arrival: 03/12/2018 20:01:00   Visit Reason: Shortness of breath; SOB Acuity: 3 LOS: 000 02:56   Address:      [address] [place] [address]  Diagnosis:      Shortness of breath  Printed Prescriptions:            Allergies      No Known Allergies      Medications Administered During Visit:                  Medication Dose Route   iopamidol 100 mL IV Contrast       Patient Medication List:              naproxen (Anaprox-DS 550 mg oral tablet) 1 Tabs Oral (given by mouth) 2 times a day as needed mild pain (1-3) for 5 Days. Refills: 0.         Major Tests and Procedures:  The following procedures and tests were performed during your ED visit.  COMMONPROCEDURES%>  COMMON PROCEDURESCOMMENTS%>          Laboratory Orders  Name Status Details   .Preg U POC Completed Urine, RT, RT - Routine, Collected, 03/12/18 20:34:00 EST, Nurse collect, 03/12/18 20:34:00 [place], RAL POC Login   .UA POC Completed Urine, RT, RT - Routine, Collected, 03/12/18 20:33:00 EST, Nurse collect, 03/12/18 20:33:00 [facility], RAL POC Login   BMP Completed Blood, Stat, ST - Stat, 03/12/18 20:08:00 EST, 03/12/18 20:08:00 EST, Nurse collect, ALLINDER-MD,  RUSSELL S, Print label Y/N   CBC Completed Blood, Stat, ST - Stat, 03/12/18 20:08:00 EST, 03/12/18 20:08:00 EST, Nurse collect, ALLINDER-MD,  RUSSELL S, Print label Y/N   D Dimer Completed Blood, Stat, ST - Stat, 03/12/18 20:08:00 EST, 03/12/18 20:08:00 EST, Nurse collect, ALLINDER-MD,  RUSSELL S, Print label Y/N   Morphology Review Completed Blood, Stat, ST - Stat, 03/12/18 20:08:00 EST,  03/12/18 20:08:00 EST, Nurse collect, 03/12/18 20:22:00 [facility], Tonny Branch, [id number]               Radiology Orders  Name Status Details   CT Angiography Chest w/ + w/o Contrast Ordered 03/12/18 20:48:00 EST, STAT 1 hour or less, Reason: ACR Select, Reason: Chest pain, acute, PE suspected, high pretest prob, Transport Mode: STRETCHER, Rad Type, pp_set_radiology_subspecialty, [id number], 9               Patient Care Orders  Name Status Details   Cardiac/NIBP/Pulse Ox Monitoring Completed 03/12/18 20:08:00 EST, This message can only be seen by Nursing, it is not visible to Pharmacy, Laboratory, or Radiology., 03/12/18 20:08:00 EST, 03/12/18 20:08:00 EST, Once   Discharge Patient Ordered 03/12/18 22:47:00 EST   Discontinue IV Ordered 03/12/18 22:47:40 EST, 03/12/18 22:47:40 EST   ED Assessment Adult Completed 03/12/18 20:10:36 EST, 03/12/18 20:10:36 EST   ED Only Oxygen Therapy Ordered 03/12/18 20:08:00 EST, 03/12/18 20:08:00 EST, Keep SAT > 92%   ED Secondary Triage  Completed 03/12/18 20:10:36 EST, 03/12/18 20:10:36 EST   ED Triage Adult Completed 03/12/18 20:01:15 EST, 03/12/18 20:01:15 EST   POC-Urine Dipstick collect Completed 03/12/18 20:09:00 EST, Once, 03/12/18 20:09:00 EST   POC-Urine Pregnancy Test collect Completed 03/12/18 20:09:00 EST, Once, 03/12/18 20:09:00 EST   Saline Lock Insert Ordered 03/12/18 20:08:00 EST, Once, 03/12/18 20:08:00 EST             PROVIDER INFORMATION               Provider Role Assigned Raeanne Gathers ED Provider 03/12/2018 20:02:09    Waits, RN, Tammy ED Nurse 03/12/2018 20:05:44    Arva Chafe A ED Provider 03/12/2018 22:07:37        Attending Physician:  Tonny Branch     Admit Doc  ALLINDER-MD,  RUSSELL S     Consulting Doc       VITALS INFORMATION  Vital Sign Triage Latest   Temp Oral ORAL_1%>37.0 degC ORAL%>36.9 degC   Temp Temporal TEMPORAL_1%> TEMPORAL%>   Temp Intravascular INTRAVASCULAR_1%> INTRAVASCULAR%>   Temp  Axillary AXILLARY_1%> AXILLARY%>   Temp Rectal RECTAL_1%> RECTAL%>   02 Sat 100 % 99 %   Respiratory Rate RATE_1%>17 br/min RATE%>18 br/min   Peripheral Pulse Rate PULSE RATE_1%>95 bpm PULSE RATE%>117 bpm   Apical Heart Rate HEART RATE_1%> HEART RATE%>   Blood Pressure BLOOD PRESSURE_1%>/ BLOOD PRESSURE_1%>80 mmHg BLOOD PRESSURE%>126 mmHg / BLOOD PRESSURE%>88 mmHg                 Immunizations      No Immunizations Documented This Visit          DISCHARGE INFORMATION   Discharge Disposition: H Outpt-Sent Home   Discharge Location:    Home   Discharge Date and Time:    03/12/2018 22:57:25   ED Checkout Date and Time:    03/12/2018 22:57:25     DEPART REASON INCOMPLETE INFORMATION               Depart Action Incomplete Reason   Interactive View/I&O Recently assessed               Problems      No Problems Documented              Smoking Status      No Smoking Status Documented         PATIENT EDUCATION INFORMATION  Instructions:       Nonspecific Chest Pain, Easy-to-Read     Follow up:                    With: Address: When:   Follow up with primary care provider  Within 2 to 4 days   Comments:   For reevaluation if symptoms not improving; return to ED if any new concerns or changes in symptoms such as new or worsening chest pain, dizziness, coughing up blood, fever, new shortness of breath or any other concerns           ED PROVIDER DOCUMENTATION     Patient:   Heather Mcmillan, Heather Mcmillan            MRN: [id number]            FIN: [id number]               Age:   62 years     Sex:  Female     DOB:  Nov 11, 1995   Associated Diagnoses:   Shortness  of breath   Author:   Tonny Branch      Basic Information   Time seen: Provider Seen (ST)   ED Provider/Time:    Floria Raveling S / 03/12/2018 20:02  .   Additional information: Chief Complaint from Nursing Triage Note   Chief Complaint  Chief Complaint: pt presents with c/o shortness of breath since yesterday worse today and increased with exertion seen for same today at  urgent care had and ekg and cxr which per pt were normal (03/12/18 20:05:00).     22 year old female presenting for concerns of exertional shortness of breath and sensation of tight breathing for the last 24 hours.  She is also endorsing some aching left calf pain.  No history of similar.  She has mild exertional chest discomfort.  She has no nausea, vomiting, diarrhea.  Minimal pain with deep breathing.  She said no recent cough, hemoptysis, unilateral leg swelling, fever or chills.  Patient was evaluated by a urgent care center today and had a negative chest x-ray.  Was encouraged to follow-up in our emergency department for further evaluation.  Patient has had no history of any DVT or PE.      Review of Systems   Constitutional symptoms:  No fever, no chills.    Skin symptoms:  Negative except as documented in HPI.   Eye symptoms:  Vision unchanged, No diplopia,    ENMT symptoms:  No ear pain, no sore throat.    Respiratory symptoms:  Shortness of breath, No cough,    Cardiovascular symptoms:  No chest pain, no palpitations.    Gastrointestinal symptoms:  No abdominal pain, no nausea, no vomiting, no diarrhea.    Genitourinary symptoms:  No dysuria, no hematuria.    Musculoskeletal symptoms:  Muscle pain.   Neurologic symptoms:  No numbness, no tingling, no focal weakness.              Additional review of systems information: All other systems reviewed and otherwise negative.      Health Status   Allergies:    Allergic Reactions (Selected)  No Known Allergies.      Past Medical/ Family/ Social History   Medical history:    No active or resolved past medical history items have been selected or recorded..   Surgical history:    No active procedure history items have been selected or recorded..   Family history:    No family history items have been selected or recorded..   Social history:    Social & Psychosocial Habits    No Data Available.   Problem list:    No qualifying data available.   Reviewed and agree with  nursing notes regarding past medical/surgical/social history      Physical Examination               Vital Signs             Time:  03/12/2018 20:10:00.   Vital Signs   03/12/2018 20:27 EST Peripheral Pulse Rate 95 bpm    Heart Rate Monitored 96 bpm    Respiratory Rate 18 br/min    SpO2 99 %   03/12/2018 20:15 EST Respiratory Rate 17 br/min   03/12/2018 20:05 EST Systolic Blood Pressure 135 mmHg    Diastolic Blood Pressure 80 mmHg    Temperature Oral 37.0 degC    Heart Rate Monitored 106 bpm  HI    Respiratory Rate 17 br/min    SpO2 100 %  Per nurse's notes.   General:  Alert, no acute distress, cooperative.    Skin:  Warm, dry, pink.    Head:  Normocephalic, atraumatic.    Neck:  Trachea midline, no tenderness, no step offs, full range of motion.    Eye:  Pupils are equal, round and reactive to light, normal conjunctiva.    Ears, nose, mouth and throat:  Oral mucosa moist, airway patent.    Cardiovascular:  Normal peripheral perfusion, Tachycardic.    Respiratory:  Lungs are clear to auscultation, respirations are non-labored, breath sounds are equal.    Musculoskeletal:  Normal ROM, no deformity, Neurovascular Intact, Tenderness to palpation to the left calf.  No unilateral leg swelling..    Chest wall   Gastrointestinal:  Soft, Nontender, Non distended, No organomegaly, Guarding: Negative.    Neurological:  Alert and oriented to person, place, time, and situation, normal motor observed, No gross sensory deficits, Glasgow coma scale: Eyes open 4 /4, verbal response 5 /5, motor response 6 /6, total score 15.    Psychiatric:  Cooperative, appropriate mood & affect.       Medical Decision Making   Rationale:  22 year old female presenting for concerns of chest tightness and shortness of breath.  Endorsed leg swelling to a urgent care today.  She is tachycardic slightly here, her d-dimer is elevated.  We will obtain a CT scan of her chest to ensure that there is no PE.Marland Kitchen   Documents reviewed:  Emergency  department nurses' notes, flowsheet, emergency department records, prior records, vital signs.    Results review:  Lab results : Lab View   03/12/2018 20:34 EST Preg U POC NEGATIVE   03/12/2018 20:33 EST Appear U POC CLEAR    Color U POC YELLOW    Bili U POC Negative    Blood U POC Trace    Glucose U POC NEGATIVE mg/dL    Ketones U POC Trace mg/dL    Leuk Est U POC Trace    Nitrite U POC Negative    pH U POC 7.0    Protein U POC Negative    Spec Grav U POC 1.025    Urobilin U POC 1.0 EU/dL   16/01/9603 54:09 EST D Dimer 0.54 mcg/mL FEU  HI       Impression and Plan   Diagnosis   Shortness of breath (ICD10-CM R06.02, Discharge, Medical)   Plan   Disposition: Patient care transitioned to: Time: 03/12/2018 21:37:00, CURRY-MD,  JASON A.    Addendum by Nelda Bucks on March 12, 2018 22:47 EST           I assumed care of this patient from Dr. Reola Mosher at 10 PM.  We discussed the patient's urgent care visit as well as her ER evaluation.  My repeat examination her heart rate is approximate 100.  She feels better overall.  Her chest wall is focally tender at left upper sternal border and the patient now recalls that she was lifting items in her room this past Saturday, 2 days ago.  This may be musculoskeletal pain as her work-up is otherwise negative.  CT of the chest was discussed with [facility] verbally and they state no dissection or PE or other acute findings.  I will recommend treatment for musculoskeletal chest pain is most likely etiology not concerning for angina no sign of pericarditis.  EKG is unremarkable

## 2018-03-12 NOTE — ED Notes (Signed)
ED Note-Nursing               Attempted saline lock placement x 2 aseptic attempts without success. Press photographer notified of same  [employer]

## 2018-03-12 NOTE — ED Provider Notes (Signed)
Dyspnea *ED        Patient:   Heather Mcmillan, Heather Mcmillan            MRN: [id number]            FIN: [id number]               Age:   22 years     Sex:  Female     DOB:  May 21, 1995   Associated Diagnoses:   Shortness of breath   Author:   Tonny Branch      Basic Information   Time seen: Provider Seen (ST)   ED Provider/Time:    Floria Raveling S / 03/12/2018 20:02  .   Additional information: Chief Complaint from Nursing Triage Note   Chief Complaint  Chief Complaint: pt presents with c/o shortness of breath since yesterday worse today and increased with exertion seen for same today at urgent care had and ekg and cxr which per pt were normal (03/12/18 20:05:00).     22 year old female presenting for concerns of exertional shortness of breath and sensation of tight breathing for the last 24 hours.  She is also endorsing some aching left calf pain.  No history of similar.  She has mild exertional chest discomfort.  She has no nausea, vomiting, diarrhea.  Minimal pain with deep breathing.  She said no recent cough, hemoptysis, unilateral leg swelling, fever or chills.  Patient was evaluated by a urgent care center today and had a negative chest x-ray.  Was encouraged to follow-up in our emergency department for further evaluation.  Patient has had no history of any DVT or PE.      Review of Systems   Constitutional symptoms:  No fever, no chills.    Skin symptoms:  Negative except as documented in HPI.   Eye symptoms:  Vision unchanged, No diplopia,    ENMT symptoms:  No ear pain, no sore throat.    Respiratory symptoms:  Shortness of breath, No cough,    Cardiovascular symptoms:  No chest pain, no palpitations.    Gastrointestinal symptoms:  No abdominal pain, no nausea, no vomiting, no diarrhea.    Genitourinary symptoms:  No dysuria, no hematuria.    Musculoskeletal symptoms:  Muscle pain.   Neurologic symptoms:  No numbness, no tingling, no focal weakness.              Additional review of systems information: All  other systems reviewed and otherwise negative.      Health Status   Allergies:    Allergic Reactions (Selected)  No Known Allergies.      Past Medical/ Family/ Social History   Medical history:    No active or resolved past medical history items have been selected or recorded..   Surgical history:    No active procedure history items have been selected or recorded..   Family history:    No family history items have been selected or recorded..   Social history:    Social & Psychosocial Habits    No Data Available.   Problem list:    No qualifying data available.   Reviewed and agree with nursing notes regarding past medical/surgical/social history      Physical Examination               Vital Signs             Time:  03/12/2018 20:10:00.   Vital Signs   03/12/2018 20:27 EST Peripheral Pulse  Rate 95 bpm    Heart Rate Monitored 96 bpm    Respiratory Rate 18 br/min    SpO2 99 %   03/12/2018 20:15 EST Respiratory Rate 17 br/min   03/12/2018 20:05 EST Systolic Blood Pressure 135 mmHg    Diastolic Blood Pressure 80 mmHg    Temperature Oral 37.0 degC    Heart Rate Monitored 106 bpm  HI    Respiratory Rate 17 br/min    SpO2 100 %                Per nurse's notes.   General:  Alert, no acute distress, cooperative.    Skin:  Warm, dry, pink.    Head:  Normocephalic, atraumatic.    Neck:  Trachea midline, no tenderness, no step offs, full range of motion.    Eye:  Pupils are equal, round and reactive to light, normal conjunctiva.    Ears, nose, mouth and throat:  Oral mucosa moist, airway patent.    Cardiovascular:  Normal peripheral perfusion, Tachycardic.    Respiratory:  Lungs are clear to auscultation, respirations are non-labored, breath sounds are equal.    Musculoskeletal:  Normal ROM, no deformity, Neurovascular Intact, Tenderness to palpation to the left calf.  No unilateral leg swelling..    Chest wall   Gastrointestinal:  Soft, Nontender, Non distended, No organomegaly, Guarding: Negative.    Neurological:  Alert and  oriented to person, place, time, and situation, normal motor observed, No gross sensory deficits, Glasgow coma scale: Eyes open 4 /4, verbal response 5 /5, motor response 6 /6, total score 15.    Psychiatric:  Cooperative, appropriate mood & affect.       Medical Decision Making   Rationale:  22 year old female presenting for concerns of chest tightness and shortness of breath.  Endorsed leg swelling to a urgent care today.  She is tachycardic slightly here, her d-dimer is elevated.  We will obtain a CT scan of her chest to ensure that there is no PE.Marland Kitchen   Documents reviewed:  Emergency department nurses' notes, flowsheet, emergency department records, prior records, vital signs.    Results review:  Lab results : Lab View   03/12/2018 20:34 EST Preg U POC NEGATIVE   03/12/2018 20:33 EST Appear U POC CLEAR    Color U POC YELLOW    Bili U POC Negative    Blood U POC Trace    Glucose U POC NEGATIVE mg/dL    Ketones U POC Trace mg/dL    Leuk Est U POC Trace    Nitrite U POC Negative    pH U POC 7.0    Protein U POC Negative    Spec Grav U POC 1.025    Urobilin U POC 1.0 EU/dL   24/40/1027 25:36 EST D Dimer 0.54 mcg/mL FEU  HI       Impression and Plan   Diagnosis   Shortness of breath (ICD10-CM R06.02, Discharge, Medical)   Plan   Disposition: Patient care transitioned to: Time: 03/12/2018 21:37:00, CURRY-MD,  JASON A.    Signature Line     Electronically Signed on 03/12/2018 09:46 PM EST   ________________________________________________   Tonny Branch               Modified by: Tonny Branch on 03/12/2018 08:50 PM EST      Modified by: Tonny Branch on 03/12/2018 08:52 PM EST      Modified by: Tonny Branch  on 03/12/2018 09:38 PM ESTAddendum by Nelda Bucks on March 12, 2018 22:47 EST               I assumed care of this patient from Dr. Reola Mosher at 10 PM.  We discussed the patient's urgent care visit as well as her ER evaluation.  My repeat examination her heart rate is  approximate 100.  She feels better overall.  Her chest wall is focally tender at left upper sternal border and the patient now recalls that she was lifting items in her room this past Saturday, 2 days ago.  This may be musculoskeletal pain as her work-up is otherwise negative.  CT of the chest was discussed with [facility] verbally and they state no dissection or PE or other acute findings.  I will recommend treatment for musculoskeletal chest pain is most likely etiology not concerning for angina no sign of pericarditis.  EKG is unremarkable  Games developer Signed on 03/12/2018 10:47 PM EST   ________________________________________________   CURRY-MD,  JASON A               Modified by: Arva Chafe A on 03/12/2018 10:47 PM EST

## 2018-03-12 NOTE — ED Notes (Signed)
ED Triage Note       ED Secondary Triage Entered On:  03/12/2018 20:16 EST    Performed On:  03/12/2018 20:15 EST by Waits, RN, Tammy               General Information   Barriers to Learning :   None evident   Languages :   English   Influenza Vaccine Status :   Not received   Pneumococcal Vaccine Status :   Not received   ED Home Meds Section :   Document assessment   [facility] ED Fall Risk Section :   Document assessment   Infectious Disease Documentation :   Document assessment   ED Advance Directives Section :   Document assessment   Waits, RN, Tammy - 03/12/2018 20:15 EST   (As Of: 03/12/2018 20:16:20 EST)   Diagnoses(Active)    Shortness of breath  Date:   03/12/2018 ; Diagnosis Type:   Reason For Visit ; Confirmation:   Complaint of ; Clinical Dx:   Shortness of breath ; Classification:   Medical ; Clinical Service:   Emergency medicine ; Code:   PNED ; Probability:   0 ; Diagnosis Code:   [id number]             -    Procedure History   (As Of: 03/12/2018 20:16:20 EST)     [facility] Perch Fall Risk Assessment Tool   Hx of falling last 3 months ED Fall :   No   Patient confused or disoriented ED Fall :   No   Patient intoxicated or sedated ED Fall :   No   Patient impaired gait ED Fall :   No   Use a mobility assistance device ED Fall :   No   Patient altered elimination ED Fall :   No   [facility] ED Fall Score :   0    Waits, RN, Tammy - 03/12/2018 20:15 EST   ED Advance Directive   Advance Directive :   No   Waits, RN, Tammy - 03/12/2018 20:15 EST   ID Risk Screen Symptoms   Recent Travel History :   No recent travel   TB Symptom Screen :   No symptoms   C. diff Symptom/History ID :   Neither of the above   Patient Pregnant :   None of the above   Waits, RN, Tammy - 03/12/2018 20:15 EST   Med Hx   Medication List   (As Of: 03/12/2018 20:16:20 EST)

## 2018-03-12 NOTE — ED Notes (Signed)
ED Triage Note       ED Triage Adult Entered On:  03/12/2018 20:10 EST    Performed On:  03/12/2018 20:05 EST by Waits, RN, Tammy               Triage   Chief Complaint :   pt presents with c/o shortness of breath since yesterday worse today and increased with exertion seen for same today at urgent care had and ekg and cxr which per pt were normal   Numeric Rating Pain Scale :   6   [place] Mode of Arrival :   Private vehicle   Temperature Oral :   37.0 degC(Converted to: 98.6 degF)    Heart Rate Monitored :   106 bpm (HI)    Respiratory Rate :   17 br/min   Systolic Blood Pressure :   135 mmHg   Diastolic Blood Pressure :   80 mmHg   SpO2 :   100 %   Oxygen Therapy :   Room air   Source of Infection :   HR > 100   SIRS Possible Source of Infection :   No   Dosing Weight Obtained By :   Patient stated   Weight Dosing :   44.9 kg(Converted to: 99 lb 0 oz)    Height :   149 cm(Converted to: 4 ft 11 in)    Body Mass Index Dosing :   20 kg/m2   ED General Section :   Document assessment   Pregnancy Status :   Patient denies   Waits, RN, Tammy - 03/12/2018 20:05 EST   DCP GENERIC CODE   Tracking Acuity :   3   Tracking Group :   ED [employer], Charity fundraiser, Tammy - 03/12/2018 20:05 EST   Approximate Last Menstrual Period :   unknown on Depo   ED Allergies Section :   Document assessment   ED Reason for Visit Section :   Document assessment   ED Quick Assessment :   Patient appears awake, alert, oriented to baseline. Skin warm and dry. Moves all extremities. Respiration even and unlabored. Appears in no apparent distress.   Waits, RN, Tammy - 03/12/2018 20:05 EST   Allergies   (As Of: 03/12/2018 20:10:36 EST)   Allergies (Active)   No Known Allergies  Estimated Onset Date:   Unspecified ; Created By:   Clementeen Hoof, RN, Tammy; Reaction Status:   Active ; Category:   Drug ; Substance:   No Known Allergies ; Type:   Allergy ; Updated By:   Clementeen Hoof, RN, Tammy; Reviewed Date:   03/12/2018 20:09 EST        Psycho-Social    Suicidal Ideation :   None   ED Homicide Ideations :   No   Feels Unsafe at Home :   No   Waits, RN, Tammy - 03/12/2018 20:05 EST   ED Reason for Visit   (As Of: 03/12/2018 20:10:36 EST)   Diagnoses(Active)    Shortness of breath  Date:   03/12/2018 ; Diagnosis Type:   Reason For Visit ; Confirmation:   Complaint of ; Clinical Dx:   Shortness of breath ; Classification:   Medical ; Clinical Service:   Emergency medicine ; Code:   PNED ; Probability:   0 ; Diagnosis Code:   [id number]

## 2018-03-12 NOTE — ED Notes (Signed)
ED Patient Education Note     Patient Education Materials Follows:  Cardiovascular     Nonspecific Chest Pain    It is often hard to find the cause of chest pain. There is always a chance that your pain could be related to something serious, such as a heart attack or a blood clot in your lungs. Chest pain can also be caused by conditions that are not life-threatening. If you have chest pain, it is very important to follow up with your doctor.          HOME CARE     If you were prescribed an antibiotic medicine, finish it all even if you start to feel better.     Avoid any activities that cause chest pain.     Do not use any tobacco products, including cigarettes, chewing tobacco, or electronic cigarettes. If you need help quitting, ask your doctor.     Do not drink alcohol.     Take medicines only as told by your doctor.     Keep all follow-up visits as told by your doctor. This is important. This includes any further testing if your chest pain does not go away.     Your doctor may tell you to keep your head raised (elevated) while you sleep.     Make lifestyle changes as told by your doctor. These may include:    ? Getting regular exercise. Ask your doctor to suggest some activities that are safe for you.    ? Eating a heart-healthy diet. Your doctor or a diet specialist (dietitian) can help you to learn healthy eating options.    ? Maintaining a healthy weight.    ? Managing diabetes, if necessary.    ? Reducing stress.    GET HELP IF:     Your chest pain does not go away, even after treatment.     You have a rash with blisters on your chest.     You have a fever.    GET HELP RIGHT AWAY IF:     Your chest pain is worse.      You have an increasing cough, or you cough up blood.     You have severe belly (abdominal) pain.     You feel extremely weak.     You pass out (faint).     You have chills.     You have sudden, unexplained chest discomfort.     You have sudden, unexplained discomfort in your arms, back, neck,  or jaw.     You have shortness of breath at any time.     You suddenly start to sweat, or your skin gets clammy.     You feel nauseous.     You vomit.     You suddenly feel light-headed or dizzy.     Your heart begins to beat quickly, or it feels like it is skipping beats.    These symptoms may be an emergency. Do not wait to see if the symptoms will go away. Get medical help right away. Call your local emergency services (911 in the [place] Do not drive yourself to the hospital.    This information is not intended to replace advice given to you by your health care provider. Make sure you discuss any questions you have with your health care provider.    Document Released: 10/05/2007 Document Revised: 05/09/2014 Document Reviewed: 11/22/2013  Elsevier Interactive Patient Education ?2016 Elsevier Inc.

## 2018-03-12 NOTE — ED Notes (Signed)
 ED Patient Summary       ;          [facility]  [address] [place], [place] [address]  [phone]  Discharge Instructions (Patient)  _______________________________________     Name:Heather Mcmillan, Heather Mcmillan  DOB:  05-Jul-1995                   MRN: [id number]                   FIN: [id number]  Reason For Visit: Shortness of breath; SOB  Final Diagnosis: Shortness of breath     Visit Date: 03/12/2018 20:01:00  Address: [address] [place] [facility] [address]  Phone: [phone]     Primary Care Provider:      Name: ROSLYNN CURTISTINE PARAS      Phone: [phone]        Emergency Department Providers:         Primary Physician:   CURRY-MD, JASON A         [facility] would like to thank you for allowing us  to assist you with your healthcare needs. The following includes patient education materials and information regarding your injury/illness.     Follow-up Instructions: You were treated today on an emergency basis, it may be wise to contact your primary care provider to notify them of your visit today. You may have been referred to your regular doctor or a specialist, please follow up as instructed. If your condition worsens or you can't get in to see the doctor, contact the Emergency Department.              With: Address: When:   Follow up with primary care provider  Within 2 to 4 days   Comments:   For reevaluation if symptoms not improving; return to ED if any new concerns or changes in symptoms such as new or worsening chest pain, dizziness, coughing up blood, fever, new shortness of breath or any other concerns              Printed Prescriptions:    Patient Education Materials:  Nonspecific Chest Pain, Easy-to-Read     Nonspecific Chest Pain    It is often hard to find the cause of chest pain. There is always a chance that your pain could be related to something serious, such as a heart attack or a blood clot in your lungs. Chest pain can also be caused by  conditions that are not life-threatening. If you have chest pain, it is very important to follow up with your doctor.          HOME CARE     If you were prescribed an antibiotic medicine, finish it all even if you start to feel better.     Avoid any activities that cause chest pain.     Do not use any tobacco products, including cigarettes, chewing tobacco, or electronic cigarettes. If you need help quitting, ask your doctor.     Do not drink alcohol.     Take medicines only as told by your doctor.     Keep all follow-up visits as told by your doctor. This is important. This includes any further testing if your chest pain does not go away.     Your doctor may tell you to keep your head raised (elevated) while you sleep.     Make lifestyle changes as told by your doctor. These  may include:    ? Getting regular exercise. Ask your doctor to suggest some activities that are safe for you.    ? Eating a heart-healthy diet. Your doctor or a diet specialist (dietitian) can help you to learn healthy eating options.    ? Maintaining a healthy weight.    ? Managing diabetes, if necessary.    ? Reducing stress.    GET HELP IF:     Your chest pain does not go away, even after treatment.     You have a rash with blisters on your chest.     You have a fever.    GET HELP RIGHT AWAY IF:     Your chest pain is worse.      You have an increasing cough, or you cough up blood.     You have severe belly (abdominal) pain.     You feel extremely weak.     You pass out (faint).     You have chills.     You have sudden, unexplained chest discomfort.     You have sudden, unexplained discomfort in your arms, back, neck, or jaw.     You have shortness of breath at any time.     You suddenly start to sweat, or your skin gets clammy.     You feel nauseous.     You vomit.     You suddenly feel light-headed or dizzy.     Your heart begins to beat quickly, or it feels like it is skipping beats.    These symptoms may be an emergency. Do not wait to  see if the symptoms will go away. Get medical help right away. Call your local emergency services (911 in the [place] Do not drive yourself to the hospital.    This information is not intended to replace advice given to you by your health care provider. Make sure you discuss any questions you have with your health care provider.    Document Released: 10/05/2007 Document Revised: 05/09/2014 Document Reviewed: 11/22/2013  Elsevier Interactive Patient Education ?2016 Elsevier Inc.         Allergy Info: No Known Allergies     Medication Information:  [facility] ED Physicians provided you with a complete list of medications post discharge, if you have been instructed to stop taking a medication please ensure you also follow up with this information to your Primary Care Physician.  Unless otherwise noted, patient will continue to take medications as prescribed prior to the Emergency Room visit.  Any specific questions regarding your chronic medications and dosages should be discussed with your physician(s) and pharmacist.          naproxen (Anaprox-DS 550 mg oral tablet) 1 Tabs Oral (given by mouth) 2 times a day as needed mild pain (1-3) for 5 Days. Refills: 0.      Medications Administered During Visit:              Medication Dose Route   iopamidol 100 mL IV Contrast          Major Tests and Procedures:  The following procedures and tests were performed during your ED visit.  PROCEDURES%>  PROCEDURES COMMENTS%>          Laboratory Orders  Name Status Details   .Preg U POC Completed Urine, RT, RT - Routine, Collected, 03/12/18 20:34:00 EST, Nurse collect, 03/12/18 20:34:00 US Robinette, RAL POC Login   .UA POC Completed Urine, RT, RT -  Routine, Collected, 03/12/18 20:33:00 EST, Nurse collect, 03/12/18 20:33:00 US Robinette, RAL POC Login   BMP Completed Blood, Stat, ST - Stat, 03/12/18 20:08:00 EST, 03/12/18 20:08:00 EST, Nurse collect, ALLINDER-MD,  RUSSELL S, Print label Y/N   CBC Completed  Blood, Stat, ST - Stat, 03/12/18 20:08:00 EST, 03/12/18 20:08:00 EST, Nurse collect, ALLINDER-MD,  RUSSELL S, Print label Y/N   D Dimer Completed Blood, Stat, ST - Stat, 03/12/18 20:08:00 EST, 03/12/18 20:08:00 EST, Nurse collect, ALLINDER-MD,  RUSSELL S, Print label Y/N   Morphology Review Completed Blood, Stat, ST - Stat, 03/12/18 20:08:00 EST, 03/12/18 20:08:00 EST, Nurse collect, 03/12/18 20:22:00 US Robinette LAMARR NELWYN GORMAN, [phone]               Radiology Orders  Name Status Details   CT Angiography Chest w/ + w/o Contrast Ordered 03/12/18 20:48:00 EST, STAT 1 hour or less, Reason: ACR Select, Reason: Chest pain, acute, PE suspected, high pretest prob, Transport Mode: STRETCHER, Rad Type, pp_set_radiology_subspecialty, [id number], 9               Patient Care Orders  Name Status Details   Cardiac/NIBP/Pulse Ox Monitoring Completed 03/12/18 20:08:00 EST, This message can only be seen by Nursing, it is not visible to Pharmacy, Laboratory, or Radiology., 03/12/18 20:08:00 EST, 03/12/18 20:08:00 EST, Once   Discharge Patient Ordered 03/12/18 22:47:00 EST   Discontinue IV Ordered 03/12/18 22:47:40 EST, 03/12/18 22:47:40 EST   ED Assessment Adult Completed 03/12/18 20:10:36 EST, 03/12/18 20:10:36 EST   ED Only Oxygen Therapy Ordered 03/12/18 20:08:00 EST, 03/12/18 20:08:00 EST, Keep SAT > 92%   ED Secondary Triage Completed 03/12/18 20:10:36 EST, 03/12/18 20:10:36 EST   ED Triage Adult Completed 03/12/18 20:01:15 EST, 03/12/18 20:01:15 EST   POC-Urine Dipstick collect Completed 03/12/18 20:09:00 EST, Once, 03/12/18 20:09:00 EST   POC-Urine Pregnancy Test collect Completed 03/12/18 20:09:00 EST, Once, 03/12/18 20:09:00 EST   Saline Lock Insert Ordered 03/12/18 20:08:00 EST, Once, 03/12/18 20:08:00 EST       ---------------------------------------------------------------------------------------------------------------------  [facility] allows you to manage your health, view your test results, and  retrieve your discharge documents from your hospital stay securely and conveniently from your computer.     To begin the enrollment process, visit [email or web address] Click on "Sign up now" under [facility].   Comment:

## 2018-03-13 LAB — CBC
Hematocrit: 39.5 % (ref 38.0–47.0)
Hemoglobin: 13.5 g/dL (ref 11.5–15.7)
MCH: 31 pg (ref 27.0–34.5)
MCHC: 34.2 g/dL (ref 32.0–36.0)
MCV: 90.8 fL (ref 81.0–99.0)
MPV: 12 fL (ref 7.2–13.2)
Platelets: 141 10*3/uL (ref 140–440)
RBC: 4.35 x10e6/mcL (ref 3.60–5.20)
RDW: 12.8 % (ref 11.0–16.0)
WBC: 4.3 10*3/uL (ref 3.8–10.6)

## 2018-03-13 LAB — BASIC METABOLIC PANEL
Anion Gap: 18 mmol/L — ABNORMAL HIGH (ref 2–17)
BUN: 16 mg/dL (ref 6–20)
CO2: 20 mmol/L — ABNORMAL LOW (ref 22–29)
Calcium: 8.7 mg/dL (ref 8.6–10.0)
Chloride: 100 mmol/L (ref 98–107)
Creatinine: 0.8 mg/dL (ref 0.5–0.9)
GFR African American: 120 mL/min/{1.73_m2} (ref >=90)
GFR Non-African American: 103 mL/min/{1.73_m2} (ref >=90)
Glucose: 93 mg/dL (ref 70–99)
OSMOLALITY CALCULATED: 276 mOsm/kg (ref 270–287)
Sodium: 138 mmol/L (ref 135–145)

## 2018-03-13 LAB — POC URINALYSIS, CHEMISTRY
Bilirubin, Urine, POC: NEGATIVE
Glucose, UA POC: NEGATIVE mg/dL
Nitrate, UA POC: NEGATIVE
Protein, Urine, POC: NEGATIVE
Specific Gravity, Urine, POC: 1.025 (ref 1.003–1.035)
UROBILIN U POC: 1 EU/dL
pH, Urine, POC: 7 (ref 4.5–8.0)

## 2018-03-13 LAB — MORPHOLOGY CHECK
Platelet Estimate: ADEQUATE
RBC Morphology: NORMAL

## 2018-03-13 LAB — POC PREGNANCY UR-QUAL: Preg Test, Ur: NEGATIVE

## 2018-03-13 LAB — D-DIMER, QUANTITATIVE: D-Dimer, Quant: 0.54 mcg/mL FEU — ABNORMAL HIGH (ref 0.19–0.51)

## 2018-05-08 NOTE — Other (Signed)
Pulmonary Function Studies       Pulmonary Function Test Entered On:  05/08/2018 13:23 EST    Performed On:  05/08/2018 13:22 EST by [facility], RRT, HEATHER D               Pulmonary Function Test   Pulmonary Function Test Performed   Pre/Post PFT :   Done   DLCO :   Done   Lung Volumes :   Done   MIZZELL, RRT, HEATHER D - 05/08/2018 13:22 EST   Medications :   Albuterol 2.5mg /3 ml (0.083%) inhalation solution   Aerosol Delivery Device :   Small volume nebulizer   Heart Rate Monitored :   98 bpm   Heart Rate Monitored :   98 bpm   Respiratory Rate :   16 br/min   Respiratory Rate :   16 br/min   SpO2 :   98 %   SpO2 :   98 %   All Lobes Breath Sounds :   Clear   All Lobes Breath Sounds :   Clear   Patient Participation in Treatment :   Cooperative   Respiratory Treatment Response :   Unchanged breath sounds   Scanned into Cerner :   Yes   Interpreting MD :   Virl Axe   Interpreting MD Notified :   Theodoro Kos, RRT, HEATHER D - 05/08/2018 13:22 EST    Signature Line     Electronically Signed on 05/08/2018 01:22 PM EST   ________________________________________________   Reuel Derby, RRT, HEATHER D

## 2018-05-08 NOTE — Other (Signed)
Pulmonary Function Test    [facility]  Eldor Conaway A. Clovis Riley, MD  Service Date: 05/08/2018    FINDINGS:  1.  Normal FEV1 2.72 (105% of predicted) and FVC 2.75 (94% of  predicted).  2.  Normal ratio FEV1 to FVC at 99%.  3.  There is no response to bronchodilator.  4.  Normal total lung capacity 4.35 (110% of predicted).  5.  Normal DLCO 98%, uncorrected.    OVERALL INTERPRETATION:  Normal PFTs.      Laverda Page, MD  TR: tn DD: 05/14/2018 13:08 TD: 05/14/2018 13:11 Job#: [id number]  \\[id number]\\DOC#: [id number]  \\[id number]\\      cc:  Daleen Snook DO  Signature Line    Electronically Signed on 05/14/2018 06:52 PM EST  ________________________________________________  Virl Axe

## 2018-05-10 LAB — FERRITIN: Ferritin: 66.3 ng/mL (ref 13.0–150.0)

## 2018-05-10 LAB — CBC WITH AUTO DIFFERENTIAL
Absolute Baso #: 0 10*3/uL (ref 0.0–0.2)
Absolute Eos #: 0.1 10*3/uL (ref 0.0–0.5)
Absolute Lymph #: 1.3 10*3/uL (ref 1.0–3.2)
Absolute Mono #: 0.3 10*3/uL (ref 0.3–1.0)
Basophils %: 0 % (ref 0.0–2.0)
Eosinophils %: 1.8 % (ref 0.0–7.0)
Hematocrit: 41.9 % (ref 38.0–47.0)
Hemoglobin: 13.8 g/dL (ref 11.5–15.7)
Immature Grans (Abs): 0 10*3/uL
Immature Granulocytes: 0 %
Lymphocytes: 37.7 % (ref 15.0–45.0)
MCH: 29.6 pg (ref 27.0–34.5)
MCHC: 32.9 g/dL (ref 32.0–36.0)
MCV: 89.9 fL (ref 81.0–99.0)
MPV: 11.3 fL (ref 7.2–13.2)
Monocytes: 8.4 % (ref 4.0–12.0)
NRBC Absolute: 0 10*3/uL
NRBC Automated: 0 %
Neutrophils %: 52.1 % (ref 42.0–74.0)
Neutrophils Absolute: 1.7 10*3/uL (ref 1.6–7.3)
Platelets: 273 10*3/uL (ref 140–440)
RBC: 4.66 x10e6/mcL (ref 3.60–5.20)
RDW: 13.1 % (ref 11.0–16.0)
WBC: 3.3 10*3/uL — ABNORMAL LOW (ref 3.8–10.6)

## 2018-05-10 LAB — TSH WITH REFLEX TO FT4: TSH: 2.04 mcIU/mL (ref 0.358–3.740)

## 2018-08-02 LAB — CBC WITH AUTO DIFFERENTIAL
Absolute Baso #: 0 10*3/uL (ref 0.0–0.2)
Absolute Eos #: 0 10*3/uL (ref 0.0–0.5)
Absolute Lymph #: 1.3 10*3/uL (ref 1.0–3.2)
Absolute Mono #: 0.3 10*3/uL (ref 0.3–1.0)
Basophils %: 0.3 % (ref 0.0–2.0)
Eosinophils %: 1 % (ref 0.0–7.0)
Hematocrit: 41.9 % (ref 38.0–47.0)
Hemoglobin: 14.2 g/dL (ref 11.5–15.7)
Immature Grans (Abs): 0 10*3/uL
Immature Granulocytes: 0.5 %
Lymphocytes: 33.8 % (ref 15.0–45.0)
MCH: 30 pg (ref 27.0–34.5)
MCHC: 33.9 g/dL (ref 32.0–36.0)
MCV: 88.4 fL (ref 81.0–99.0)
MPV: 11.7 fL (ref 7.2–13.2)
Monocytes: 6.9 % (ref 4.0–12.0)
NRBC Absolute: 0 10*3/uL
NRBC Automated: 0 %
Neutrophils %: 57.5 % (ref 42.0–74.0)
Neutrophils Absolute: 2.3 10*3/uL (ref 1.6–7.3)
Platelets: 243 10*3/uL (ref 140–440)
RBC: 4.74 x10e6/mcL (ref 3.60–5.20)
RDW: 12.2 % (ref 11.0–16.0)
WBC: 3.9 10*3/uL (ref 3.8–10.6)

## 2018-08-02 LAB — VITAMIN B12: Vitamin B-12: 528 pg/mL (ref 232–1245)

## 2018-08-02 LAB — COMPREHENSIVE METABOLIC PANEL
ALT: 17 U/L (ref 0–33)
AST: 23 U/L (ref 0–32)
Albumin/Globulin Ratio: 1.5 mmol/L (ref 1.00–2.00)
Albumin: 4.6 g/dL (ref 3.5–5.2)
Alk Phosphatase: 82 U/L (ref 35–117)
Anion Gap: 12 mmol/L (ref 2–17)
BUN: 8 mg/dL (ref 6–20)
CO2: 23 mmol/L (ref 22–29)
Calcium: 9.6 mg/dL (ref 8.6–10.0)
Chloride: 105 mmol/L (ref 98–107)
Creatinine: 0.6 mg/dL (ref 0.5–0.9)
GFR African American: 147 mL/min/{1.73_m2} (ref >=90)
GFR Non-African American: 127 mL/min/{1.73_m2} (ref >=90)
Globulin: 3 g/dL (ref 1.9–4.4)
Glucose: 85 mg/dL (ref 70–99)
OSMOLALITY CALCULATED: 277 mOsm/kg (ref 270–287)
Potassium: 4.4 mmol/L (ref 3.5–5.3)
Sodium: 140 mmol/L (ref 135–145)
Total Bilirubin: 0.2 mg/dL (ref 0.00–1.20)
Total Protein: 7.6 g/dL (ref 6.4–8.3)

## 2018-08-02 LAB — FERRITIN: Ferritin: 70 ng/mL (ref 13.0–150.0)

## 2018-08-03 LAB — VITAMIN D 1,25 DIHYDROXY: Vit D, 1,25-Dihydroxy: 39.8 pg/mL (ref 19.9–79.3)

## 2019-09-07 LAB — COVID-19: SARS COV2, NAA (BD): NOT DETECTED

## 2019-12-04 LAB — PAP LB, REFLEX HPV ASCUS (199300): .: 0

## 2020-02-16 LAB — POC COVID-19 (LIAT IN HOUSE): SARS-CoV-2: NOT DETECTED

## 2020-12-16 ENCOUNTER — Encounter
Payer: PRIVATE HEALTH INSURANCE | Attending: Student in an Organized Health Care Education/Training Program | Primary: Family Medicine

## 2020-12-16 NOTE — Progress Notes (Deleted)
CHIEF COMPLAINT:  No chief complaint on file.       HISTORY OF PRESENT ILLNESS:  Patient presents with {left/right/bi:30031} ear pain.  Symptoms include {otitis symptoms:327}. Symptoms began {numbers; 0-10:33138} {unit:11} ago and are {course:17::"unchanged"} since that time. Patient denies {respiratory symptoms:16811}. Ear history: {numbers; 0-10:33138} previous ear infections.       PHQ:  No flowsheet data found.    CURRENT MEDICATION LIST:    Current Outpatient Medications   Medication Sig Dispense Refill    traZODone (DESYREL) 50 MG tablet TAKE 1 TABLET BY MOUTH EVERY DAY AT BEDTIME AS NEEDED FOR 30 DAYS      LORazepam (ATIVAN) 0.5 MG tablet 1 tablet at bedtime as needed Orally Once a day for 30 days      medroxyPROGESTERone (DEPO-PROVERA) 150 MG/ML injection 1 ml Intramuscular      sertraline (ZOLOFT) 50 MG tablet 1 tablet Orally Once a day for 30 day(s)       No current facility-administered medications for this visit.        ALLERGIES:    Not on File     HISTORY:  No past medical history on file.   No past surgical history on file.   Social History     Socioeconomic History    Marital status: Single     Spouse name: Not on file    Number of children: Not on file    Years of education: Not on file    Highest education level: Not on file   Occupational History    Not on file   Tobacco Use    Smoking status: Not on file    Smokeless tobacco: Not on file   Substance and Sexual Activity    Alcohol use: Not on file    Drug use: Not on file    Sexual activity: Not on file   Other Topics Concern    Not on file   Social History Narrative    Not on file     Social Determinants of Health     Financial Resource Strain: Not on file   Food Insecurity: Not on file   Transportation Needs: Not on file   Physical Activity: Not on file   Stress: Not on file   Social Connections: Not on file   Intimate Partner Violence: Not on file   Housing Stability: Not on file      No family history on file.     REVIEW OF SYSTEMS:  Review of  systems is as indicated in HPI otherwise negative.    PHYSICAL EXAM:  Vital Signs -   There were no vitals taken for this visit.       {PHYSICAL EXAM WITH PROVIDER CHOICES:21747}        LABS  No results found for this visit on 12/16/20.  [facility] Historical Encounter on 12/03/2019   Component Date Value Ref Range Status    Diagnosis: 12/03/2019    Final    NEGATIVE FOR INTRAEPITHELIAL LESION OR MALIGNANCY.    Specimen adequacy: 12/03/2019    Final    Comment: Satisfactory for evaluation. Endocervical and/or squamous metaplastic  cells (endocervical component) are present.  Partially obscuring thick areas are present.      Clinician Provided ICD 12/03/2019    Final    Z12.4    Performed by: 12/03/2019    Final    Jimmy Picket, Supervisory Cytotechnologist (ASCP)    . 12/03/2019 .   Final    Note: 12/03/2019  Final    Comment: The Pap smear is a screening test designed to aid in the detection of  premalignant and malignant conditions of the uterine cervix.  It is not a  diagnostic procedure and should not be used as the sole means of detecting  cervical cancer.  Both false-positive and false-negative reports do occur.  .      Methodology: 12/03/2019    Final    Comment: This liquid based ThinPrep(R) pap test was screened with the  use of an image guided system.      Marland Kitchen 12/03/2019    Final    Comment: The HPV DNA reflex criteria were not met with this specimen result  therefore, no HPV testing was performed.  Marland Kitchen  PERFORMING LAB: [facility], [address] [place], [place] [id number], Phone - [phone], Director - MDNagendra  Unless Otherwise Notified, Test Performed At : [facility], , [place] , [place]  [address] [place], [place], [address] [phone]         IMPRESSION/PLAN    There are no diagnoses linked to this encounter.            Follow up and Dispositions:  No follow-ups on file.       Harold Hedge, [place]

## 2021-01-19 ENCOUNTER — Encounter: Admit: 2021-01-19 | Discharge: 2021-01-19 | Payer: PRIVATE HEALTH INSURANCE | Primary: Family Medicine

## 2021-01-19 ENCOUNTER — Encounter: Payer: PRIVATE HEALTH INSURANCE | Attending: Family Medicine | Primary: Family Medicine

## 2021-01-19 DIAGNOSIS — Z3042 Encounter for surveillance of injectable contraceptive: Secondary | ICD-10-CM

## 2021-01-19 MED ORDER — MEDROXYPROGESTERONE ACETATE 150 MG/ML IM SUSY
150 MG/ML | INTRAMUSCULAR | Status: AC
Start: 2021-01-19 — End: ?
  Administered 2021-01-19: 14:00:00 150 mg via INTRAMUSCULAR

## 2021-04-20 ENCOUNTER — Encounter: Admit: 2021-04-20 | Discharge: 2021-04-20 | Payer: PRIVATE HEALTH INSURANCE | Primary: Family Medicine

## 2021-04-20 DIAGNOSIS — Z3042 Encounter for surveillance of injectable contraceptive: Secondary | ICD-10-CM

## 2021-04-20 MED ORDER — MEDROXYPROGESTERONE ACETATE 150 MG/ML IM SUSY
150 MG/ML | INTRAMUSCULAR | Status: AC
Start: 2021-04-20 — End: ?
  Administered 2021-04-20: 14:00:00 150 mg via INTRAMUSCULAR

## 2021-04-28 ENCOUNTER — Ambulatory Visit
Admit: 2021-04-28 | Discharge: 2021-04-29 | Payer: PRIVATE HEALTH INSURANCE | Attending: Medical | Primary: Family Medicine

## 2021-04-28 DIAGNOSIS — R059 Cough, unspecified: Secondary | ICD-10-CM

## 2021-04-28 LAB — POC COVID-19 & INFLUENZA COMBO (LIAT IN HOUSE)
INFLUENZA A: NOT DETECTED
INFLUENZA B: NOT DETECTED
SARS-CoV-2: NOT DETECTED

## 2021-04-28 LAB — AMB POC RAPID STREP A: Group A Strep Antigen, POC: NEGATIVE

## 2021-04-28 NOTE — Progress Notes (Signed)
Heather Mcmillan (DOB:  [birth year]) is a 25 y.o. female, here for evaluation of the following chief complaint(s):  Cough, Pharyngitis, Headache, and Congestion    Review of Systems   All other systems reviewed and are negative.    History of Present Illness:   25yo female presents with right ear pain, sinus pain, cough. She reprots symptoms starting almost a week ago. She has been using nyquil, tylenol for symptoms without much relief. She denies fever/chills, N/V/D, body aches.    Physical Exam  Vitals and nursing note reviewed.   Constitutional:       Appearance: Normal appearance. She is ill-appearing.   HENT:      Head: Normocephalic and atraumatic.      Right Ear: Tympanic membrane is erythematous and bulging.      Left Ear: Tympanic membrane is bulging.      Mouth/Throat:      Comments: Mild injection, increased clear drainage posteriorly  Eyes:      Extraocular Movements: Extraocular movements intact.   Cardiovascular:      Rate and Rhythm: Normal rate and regular rhythm.   Pulmonary:      Effort: Pulmonary effort is normal.      Breath sounds: Normal breath sounds.   Musculoskeletal:         General: Normal range of motion.      Cervical back: Normal range of motion.   Skin:     General: Skin is warm and dry.   Neurological:      General: No focal deficit present.      Mental Status: She is alert and oriented to person, place, and time.   Psychiatric:         Mood and Affect: Mood normal.         Behavior: Behavior normal.       ASSESSMENT/PLAN:  Pt with sore throat, cough, congestion - strep, covid/flu swabs ordered. Tests negative, pt informed. Pt with signs/symptoms AOM, sinusitis - will treat with Augmentin, Flonase, Bromfed DM. Advised to increase fluids, add mucinex for cough expectoration, heated humidification.     No follow-ups on file.     Allergies   Allergen Reactions    Shellfish Allergy Rash     Vitals:    04/28/21 1818   BP: 132/87   Pulse: 100   Resp: 18   Temp: 98.1 ??F (36.7 ??C)   SpO2: 99%       Results for orders placed or performed in visit on 04/28/21   POC Strep A Assay w/Optic (54098)   Result Value Ref Range    Valid Internal Control, POC yes     Group A Strep Antigen, POC Negative Negative   POC COVID-19 & Influenza Combo ([facility])   Result Value Ref Range    SARS-CoV-2 Not Detected Not Detected    INFLUENZA A Not Detected Not Detected    INFLUENZA B Not Detected Not Detected     Results for orders placed or performed in visit on 04/28/21   POC Strep A Assay w/Optic (11914)   Result Value Ref Range    Valid Internal Control, POC yes     Group A Strep Antigen, POC Negative Negative   POC COVID-19 & Influenza Combo ([facility])   Result Value Ref Range    SARS-CoV-2 Not Detected Not Detected    INFLUENZA A Not Detected Not Detected    INFLUENZA B Not Detected Not Detected    Narrative  Is this test for diagnosis or screening?->Diagnosis of ill patient  Symptomatic for COVID-19 as defined by CDC?->Yes  Date of Symptom Onset->04/26/21  Hospitalized for COVID-19?->No  Admitted to ICU for COVID-19?->No  Employed in healthcare setting?->Unknown  Resident in a congregate (group) care setting?->Unknown  Pregnant?->Unknown  Previously tested for COVID-19?->Yes      Visit Diagnoses         Codes    Cough, unspecified type    -  Primary R05.9    Sorethroat     J02.9           No problem-specific Assessment & Plan notes found for this encounter.     Electronically signed by:  --Louanna Raw, PA

## 2021-04-29 MED ORDER — FLUTICASONE PROPIONATE 50 MCG/ACT NA SUSP
50 MCG/ACT | NASAL | 0 refills | Status: AC
Start: 2021-04-29 — End: 2022-04-15

## 2021-04-29 MED ORDER — AMOXICILLIN-POT CLAVULANATE 875-125 MG PO TABS
875-125 MG | ORAL_TABLET | Freq: Two times a day (BID) | ORAL | 0 refills | Status: AC
Start: 2021-04-29 — End: 2021-05-08

## 2021-04-29 MED ORDER — PSEUDOEPH-BROMPHEN-DM 30-2-10 MG/5ML PO SYRP
2-30-105 MG/5ML | Freq: Four times a day (QID) | ORAL | 0 refills | Status: DC | PRN
Start: 2021-04-29 — End: 2022-04-15

## 2021-05-20 ENCOUNTER — Encounter

## 2021-06-02 NOTE — Progress Notes (Deleted)
CHIEF COMPLAINT:  No chief complaint on file.       HISTORY OF PRESENT ILLNESS:  Heather Mcmillan is a 26 y.o. female who complains of {UTI sx:16137} She has had symptoms for {0-10:33138} {time units:11}. Patient also complains of {ros UTI:16138}. Patient denies {ros UTI:16138}. Patient {does/does not:33181} have a history of recurrent UTI ,and {does/does not:33181} have a history of pyelonephritis. Patient {IS/IS WVP:71062} experiencing {Symptoms; vaginal:60339}      CURRENT MEDICATION LIST:  Current Outpatient Medications   Medication Sig Dispense Refill    fluticasone (FLONASE) 50 MCG/ACT nasal spray 1 spray each nostril once to twice a day as needed 16 g 0    brompheniramine-pseudoephedrine-DM 2-30-10 MG/5ML syrup Take 10 mLs by mouth 4 times daily as needed for Cough 200 mL 0    traZODone (DESYREL) 50 MG tablet TAKE 1 TABLET BY MOUTH EVERY DAY AT BEDTIME AS NEEDED FOR 30 DAYS      LORazepam (ATIVAN) 0.5 MG tablet 1 tablet at bedtime as needed Orally Once a day for 30 days      medroxyPROGESTERone (DEPO-PROVERA) 150 MG/ML injection 1 ml Intramuscular      sertraline (ZOLOFT) 50 MG tablet 1 tablet Orally Once a day for 30 day(s)       Current Facility-Administered Medications   Medication Dose Route Frequency Provider Last Rate Last Admin    medroxyPROGESTERone (DEPO-PROVERA) injection 150 mg  150 mg IntraMUSCular Q3 Months Devin Going, DO   150 mg at 04/20/21 0920    medroxyPROGESTERone (DEPO-PROVERA) injection 150 mg  150 mg IntraMUSCular Q3 Months Devin Going, DO   150 mg at 01/19/21 6948        ALLERGIES:    Allergies   Allergen Reactions    Shellfish Allergy Rash          REVIEW OF SYSTEMS:  Review of systems is as indicated in HPI otherwise negative.    PHYSICAL EXAM:  Vital Signs -   There were no vitals taken for this visit.             GENERAL APPEARANCE: alert and oriented to person, place and time, in no acute distress, appears stated age, nontoxic.           HEAD:  normocephalic, atraumatic .            EYES:  extraocular movement intact (EOMI) .           ORAL CAVITY:  mucosa moist .           HEART:  RRR. S1, S2 normal, no murmurs, rubs, or gallops .           LUNGS:  clear to auscultation bilaterally, no wheezes, rales, rhonchi. Eupneic respirations..           ABDOMEN: soft, nontender, nondistended           GENITOURINARY:  No CVAT bilat.           SKIN:  warm and dry .           EXTREMITIES:  no edema.           PSYCH:  mood/affect appropriate, speech clear .   ***   LABS:  No results found for this visit on 06/02/21.      IMPRESSION/PLAN    There are no diagnoses linked to this encounter.      Follow up and Dispositions:  No follow-ups on file.

## 2021-07-20 ENCOUNTER — Encounter: Admit: 2021-07-20 | Discharge: 2021-07-20 | Payer: PRIVATE HEALTH INSURANCE | Primary: Family Medicine

## 2021-07-20 ENCOUNTER — Encounter

## 2021-07-20 DIAGNOSIS — Z3042 Encounter for surveillance of injectable contraceptive: Secondary | ICD-10-CM

## 2021-07-20 MED ORDER — TRAZODONE HCL 50 MG PO TABS
50 MG | ORAL_TABLET | ORAL | 3 refills | Status: AC
Start: 2021-07-20 — End: 2022-04-15

## 2021-07-20 MED ORDER — MEDROXYPROGESTERONE ACETATE 150 MG/ML IM SUSY
150 MG/ML | Freq: Once | INTRAMUSCULAR | Status: AC
Start: 2021-07-20 — End: 2021-07-20
  Administered 2021-07-20: 13:00:00 150 mg via INTRAMUSCULAR

## 2021-07-20 NOTE — Telephone Encounter (Signed)
Rx loaded and ready to send

## 2021-10-20 ENCOUNTER — Encounter: Admit: 2021-10-20 | Discharge: 2021-10-20 | Payer: PRIVATE HEALTH INSURANCE | Primary: Family Medicine

## 2021-10-20 DIAGNOSIS — Z309 Encounter for contraceptive management, unspecified: Secondary | ICD-10-CM

## 2021-10-20 MED ORDER — MEDROXYPROGESTERONE ACETATE 150 MG/ML IM SUSP
150 MG/ML | Freq: Once | INTRAMUSCULAR | Status: AC
Start: 2021-10-20 — End: 2021-10-20
  Administered 2021-10-20: 13:00:00 150 mg via INTRAMUSCULAR

## 2022-01-20 ENCOUNTER — Encounter: Payer: PRIVATE HEALTH INSURANCE | Primary: Family Medicine

## 2022-01-20 ENCOUNTER — Encounter: Admit: 2022-01-20 | Discharge: 2022-01-20 | Payer: PRIVATE HEALTH INSURANCE | Primary: Family Medicine

## 2022-01-20 DIAGNOSIS — Z3042 Encounter for surveillance of injectable contraceptive: Secondary | ICD-10-CM

## 2022-01-20 MED ORDER — MEDROXYPROGESTERONE ACETATE 150 MG/ML IM SUSY
150 MG/ML | Freq: Once | INTRAMUSCULAR | Status: AC
Start: 2022-01-20 — End: 2022-01-20
  Administered 2022-01-20: 13:00:00 150 mg via INTRAMUSCULAR

## 2022-01-20 NOTE — Telephone Encounter (Signed)
Patient need an appointment for her 3 month depo shot.       Please assists.

## 2022-01-21 NOTE — Telephone Encounter (Signed)
Can we please schedule 3 month follow up nurse visit depo shot

## 2022-04-13 NOTE — Telephone Encounter (Signed)
Patient is requesting a call back to discuss the time frame she can get her depo shot. Her appt is 12/28, but in the past its always been on the 20th or 21st of the month. She wants to make sure she isn't late and have to take a pregnancy test    Please call back and assist

## 2022-04-14 NOTE — Telephone Encounter (Signed)
Patient is scheduled tomorrow.

## 2022-04-15 ENCOUNTER — Ambulatory Visit: Admit: 2022-04-15 | Attending: Family Medicine | Primary: Family Medicine

## 2022-04-15 DIAGNOSIS — F418 Other specified anxiety disorders: Secondary | ICD-10-CM

## 2022-04-15 MED ORDER — MEDROXYPROGESTERONE ACETATE 150 MG/ML IM SUSY
150 MG/ML | Freq: Once | INTRAMUSCULAR | Status: AC
Start: 2022-04-15 — End: 2022-04-15
  Administered 2022-04-15: 20:00:00 150 mg via INTRAMUSCULAR

## 2022-04-15 MED ORDER — SERTRALINE HCL 50 MG PO TABS
50 MG | ORAL_TABLET | Freq: Every day | ORAL | 1 refills | Status: DC
Start: 2022-04-15 — End: 2023-04-18

## 2022-04-15 MED ORDER — TRAZODONE HCL 50 MG PO TABS
50 MG | ORAL_TABLET | ORAL | 3 refills | Status: AC
Start: 2022-04-15 — End: 2022-07-15

## 2022-04-15 NOTE — Progress Notes (Signed)
CHIEF COMPLAINT:  Chief Complaint   Patient presents with    Follow-up     3 follow up and depo         HISTORY OF PRESENT ILLNESS:  Heather Mcmillan is a 26 y.o. female  who presents clinic for 3 month follow up and depo shot.    She states she has had three episode of 1 day of heavy bleeding. States she does not have cramps only bleeding. She takes Depo every three months.     Patient is very tearful and emotional today. She states she takes her grandmother to all her appointments, she is working full time, she lost her god -mom in April, grand-mom fell in march. She is feeling very tired. She was on Zoloft in the past  but came off the Zoloft when she was feeling better. She would like to discuss re-starting another medication.          PHQ:      04/15/2022     2:11 PM   PHQ-9    Little interest or pleasure in doing things 2   Feeling down, depressed, or hopeless 3   Trouble falling or staying asleep, or sleeping too much 1   Feeling tired or having little energy 0   Poor appetite or overeating 3   Feeling bad about yourself - or that you are a failure or have let yourself or your family down 1   Trouble concentrating on things, such as reading the newspaper or watching television 0   Moving or speaking so slowly that other people could have noticed. Or the opposite - being so fidgety or restless that you have been moving around a lot more than usual 0   Thoughts that you would be better off dead, or of hurting yourself in some way 0   PHQ-2 Score 5   PHQ-9 Total Score 10   If you checked off any problems, how difficult have these problems made it for you to do your work, take care of things at home, or get along with other people? 1       CURRENT MEDICATION LIST:    Current Outpatient Medications   Medication Sig Dispense Refill    traZODone (DESYREL) 50 MG tablet TAKE 1 TABLET BY MOUTH EVERY DAY AT BEDTIME AS NEEDED FOR 30 DAYS 90 tablet 3    sertraline (ZOLOFT) 50 MG tablet Take 1 tablet by mouth daily 90 tablet 1     medroxyPROGESTERone (DEPO-PROVERA) 150 MG/ML injection 1 ml Intramuscular       Current Facility-Administered Medications   Medication Dose Route Frequency Provider Last Rate Last Admin    medroxyPROGESTERone (DEPO-PROVERA) injection 150 mg  150 mg IntraMUSCular Q3 Months Heather Going, DO   150 mg at 04/20/21 5465    medroxyPROGESTERone (DEPO-PROVERA) injection 150 mg  150 mg IntraMUSCular Q3 Months Heather Going, DO   150 mg at 01/19/21 0354        ALLERGIES:    Allergies   Allergen Reactions    Shellfish Allergy Rash        HISTORY:  Past Medical History:   Diagnosis Date    Anxiety     Asthma       No past surgical history on file.   Social History     Socioeconomic History    Marital status: Single     Spouse name: Not on file    Number of children: Not on file  Years of education: Not on file    Highest education level: Not on file   Occupational History    Not on file   Tobacco Use    Smoking status: Never    Smokeless tobacco: Never   Vaping Use    Vaping Use: Never used   Substance and Sexual Activity    Alcohol use: Never    Drug use: Never    Sexual activity: Yes     Partners: Male   Other Topics Concern    Not on file   Social History Narrative    Not on file     Social Determinants of Health     Financial Resource Strain: Not on file   Food Insecurity: Not on file   Transportation Needs: Not on file   Physical Activity: Not on file   Stress: Not on file   Social Connections: Not on file   Intimate Partner Violence: Not on file   Housing Stability: Not on file      No family history on file.     REVIEW OF SYSTEMS:  Review of systems is as indicated in HPI otherwise negative.    PHYSICAL EXAM:  Physical Exam  Constitutional:       General: She is not in acute distress.     Appearance: Normal appearance. She is not ill-appearing, toxic-appearing or diaphoretic.   HENT:      Head: Normocephalic and atraumatic.      Right Ear: Ear canal normal.      Left Ear: Ear canal normal.      Mouth/Throat:       Mouth: Mucous membranes are moist.   Eyes:      Extraocular Movements: Extraocular movements intact.   Neck:      Vascular: No carotid bruit.   Cardiovascular:      Rate and Rhythm: Normal rate and regular rhythm.      Heart sounds: No murmur heard.     No gallop.   Pulmonary:      Effort: Pulmonary effort is normal. No respiratory distress.      Breath sounds: Normal breath sounds. No wheezing or rhonchi.   Abdominal:      General: Bowel sounds are normal. There is no distension.      Palpations: Abdomen is soft.      Tenderness: There is no abdominal tenderness. There is no guarding or rebound.   Musculoskeletal:      Right lower leg: No edema.      Left lower leg: No edema.   Skin:     General: Skin is warm.      Coloration: Skin is not jaundiced.      Findings: No erythema.   Neurological:      Mental Status: She is alert and oriented to person, place, and time.   Psychiatric:         Behavior: Behavior normal.      Comments: Mood: Sad, affect: Normal        Vital Signs -   Visit Vitals  BP 110/70 (Site: Left Upper Arm, Position: Sitting, Cuff Size: Small Adult)   Pulse 92   Ht 1.499 m (4\' 11" )   Wt 54 kg (119 lb)   SpO2 98%   BMI 24.04 kg/m              LABS  No results found for this visit on 04/15/22.  Office Visit on 04/28/2021   Component Date Value Ref Range  Status    Valid Internal Control, POC 04/28/2021 yes   Final    Group A Strep Antigen, POC 04/28/2021 Negative  Negative Final    SARS-CoV-2 04/28/2021 Not Detected  Not Detected Final    Comment: Test performed at [facility] [address] [place],  [place] [address]  The cobas SARS-CoV-2 & Influenza A/B Nucleic acid test for use on the cobas  Liat System (cobas SARS-CoV-2 & Influenza A/B) is an automated multiplex  real-time RT-PCR assay intended for the simultaneous rapid in vitro  qualitative detection and differentiation of SARS-CoV-2, influenza A, and  influenza B virus RNA in healthcare provider-collected nasopharyngeal  and  nasal swabs, from individuals suspected of respiratory viral infection  consistent with COVID-19 by their healthcare provider.  Cobas SARS-CoV-2 & Influenza A/B is intended for use in the simultaneous rapid  in vitro detection and differentiation of SARS-CoV-2, influenza A virus, and  influenza B virus nucleic acids in clinical specimens and is not intended to  detect influenza C virus. SARS-CoV-2, influenza A and influenza B viral RNA is  generally detectable in respiratory specimens during the acute phase of  infection. Positive resul                           ts are indicative of active infection but do not rule  out bacterial infection or co-infection with other pathogens not detected by  the test. Clinical correlation with patient history and other diagnostic  information is necessary to determine patient infection status. The agent  detected may not be the definite cause of disease.  Negative results do not preclude SARS-CoV-2, influenza A and/or influenza B  infection and should not be used as the sole basis for diagnosis, treatment or  other patient management decisions. Negative results must be combined with  clinical observations, patient history, and/or epidemiological information.  The cobas SARS-CoV-2 & Influenza A/B Nucleic acid test is only for use under  the Food and Drug Administration's Emergency Use Authorization.      INFLUENZA A 04/28/2021 Not Detected  Not Detected Final    Comment: Test performed at [facility], [place],  [place] [address]      INFLUENZA B 04/28/2021 Not Detected  Not Detected Final    Comment: Test performed at [facility], [place],  [place] [address]         IMPRESSION/PLAN    Encounter Diagnoses   Name Primary?    Anxiety with depression Yes    Primary insomnia     Encounter for Depo-Provera contraception      1. Anxiety with depression  Assessment & Plan:    uncontrolled, patient not currently on any medication.   Patient was previously on Zoloft and benefited from its use.  Patient did not tolerate BuSpar well previously.  CBT discussed with patient.  Will restart patient on Zoloft.  Patient able to contract for safety.  Patient will notify me for any acute negative changes in mood.  Orders:  -     traZODone (DESYREL) 50 MG tablet; TAKE 1 TABLET BY MOUTH EVERY DAY AT BEDTIME AS NEEDED FOR 30 DAYS, Disp-90 tablet, R-3Normal  -     sertraline (ZOLOFT) 50 MG tablet; Take 1 tablet by mouth daily, Disp-90 tablet, R-1Normal  2. Primary insomnia  Assessment & Plan:    sleep hygiene discussed with patient.  Patient reports symptoms improved on trazodone.  Will continue trazodone.  Orders:  -     traZODone (DESYREL) 50 MG tablet; TAKE 1 TABLET BY MOUTH EVERY DAY AT BEDTIME AS NEEDED FOR 30 DAYS, Disp-90 tablet, R-3Normal  3. Encounter for Depo-Provera contraception  Assessment & Plan:    patient reports Depo-Provera generally working well.  She has been on Depo-Provera for over a year.  She has been getting some intermittent spotting recently.  .  Suspect this could be related to recent stress.  Encourage patient to occasionally take pregnancy test for reassurance.  Patient will notify me if she develops any vaginal discharge or pelvic pain so that we can run STD testing.  We could consider coadministration of estrogen via estrogen patch in the future if symptoms not fully resolved.  Patient already taking ibuprofen intermittently for headaches.  Orders:  -     medroxyPROGESTERone (DEPO-PROVERA) injection 150 mg; 150 mg, IntraMUSCular, ONCE, 1 dose, On Fri 04/15/22 at 1530           Follow up and Dispositions:  Return in about 3 months (around 07/15/2022).       Heather GoingAnthony J Laiya Wisby, DO

## 2022-04-16 NOTE — Assessment & Plan Note (Signed)
uncontrolled, patient not currently on any medication.  Patient was previously on Zoloft and benefited from its use.  Patient did not tolerate BuSpar well previously.  CBT discussed with patient.  Will restart patient on Zoloft.  Patient able to contract for safety.  Patient will notify me for any acute negative changes in mood.

## 2022-04-16 NOTE — Assessment & Plan Note (Signed)
sleep hygiene discussed with patient.  Patient reports symptoms improved on trazodone.  Will continue trazodone.

## 2022-04-16 NOTE — Assessment & Plan Note (Addendum)
patient reports Depo-Provera generally working well.  She has been on Depo-Provera for over a year.  She has been getting some intermittent spotting recently.  .  Suspect this could be related to recent stress.  Encourage patient to occasionally take pregnancy test for reassurance.  Patient will notify me if she develops any vaginal discharge or pelvic pain so that we can run STD testing.  We could consider coadministration of estrogen via estrogen patch in the future if symptoms not fully resolved.  Patient already taking ibuprofen intermittently for headaches.

## 2022-04-27 ENCOUNTER — Encounter: Primary: Family Medicine

## 2022-06-07 NOTE — Telephone Encounter (Signed)
Patient is requesting to speak with sara. No details provided

## 2022-06-07 NOTE — Telephone Encounter (Signed)
Patient is calling back and  requesting to speak with [name]. No details provided

## 2022-06-21 NOTE — Telephone Encounter (Signed)
See TE under her grandmother

## 2022-06-21 NOTE — Telephone Encounter (Addendum)
Patient is calling again to speak with clinical staff, please assist. No details given.    Patient states she needs a callback by 4:30PM

## 2022-07-15 ENCOUNTER — Ambulatory Visit: Admit: 2022-07-15 | Discharge: 2022-07-15 | Attending: Family Medicine | Primary: Family Medicine

## 2022-07-15 DIAGNOSIS — F5101 Primary insomnia: Secondary | ICD-10-CM

## 2022-07-15 LAB — AMB POC URINE PREGNANCY TEST, VISUAL COLOR COMPARISON: HCG, Pregnancy, Urine, POC: NEGATIVE

## 2022-07-15 MED ORDER — MEDROXYPROGESTERONE ACETATE 150 MG/ML IM SUSY
150 | Freq: Once | INTRAMUSCULAR | Status: AC
Start: 2022-07-15 — End: 2022-07-15
  Administered 2022-07-15: 18:00:00 150 mg via INTRAMUSCULAR

## 2022-07-15 MED ORDER — TRAZODONE HCL 50 MG PO TABS
50 | ORAL_TABLET | ORAL | 3 refills | Status: DC
Start: 2022-07-15 — End: 2024-02-15

## 2022-07-15 NOTE — Assessment & Plan Note (Signed)
patient reports symptoms controlled on trazodone.  Patient continue good sleep hygiene.

## 2022-07-15 NOTE — Progress Notes (Signed)
CHIEF COMPLAINT:  Chief Complaint   Patient presents with    Follow-up     Follow up         HISTORY OF PRESENT ILLNESS:  Ms. Heather Mcmillan is a 27 y.o. female  who presents clinic for follow up to anxiety.  She states her anxiety is stable with Zoloft 50 mg. She states she is feeling more "normal"     She states her Insomnia is stable on Trazodone.   She is requesting refill today.    She is due for Depo injection today.     She states she has cut out fast food and has noticed since she has not had much of an appetite     States she regular with Bms.  No nausea.  No early saiety     S-I-G+(relationship with grandmother)E-C-A-P-S-  [facility]- His    Walks for exercise    Last pap reportedly 5 years ago, due for new pap.     PHQ:       07/15/2022    10:21 AM   PHQ-9    Little interest or pleasure in doing things 0   Feeling down, depressed, or hopeless 0   Trouble falling or staying asleep, or sleeping too much 0   Feeling tired or having little energy 0   Poor appetite or overeating 0   Feeling bad about yourself - or that you are a failure or have let yourself or your family down 0   Trouble concentrating on things, such as reading the newspaper or watching television 0   Moving or speaking so slowly that other people could have noticed. Or the opposite - being so fidgety or restless that you have been moving around a lot more than usual 0   PHQ-2 Score 0   PHQ-9 Total Score 0   If you checked off any problems, how difficult have these problems made it for you to do your work, take care of things at home, or get along with other people? 0       CURRENT MEDICATION LIST:    Current Outpatient Medications   Medication Sig Dispense Refill    traZODone (DESYREL) 50 MG tablet TAKE 1 TABLET BY MOUTH EVERY DAY AT BEDTIME AS NEEDED FOR 30 DAYS 90 tablet 3    sertraline (ZOLOFT) 50 MG tablet Take 1 tablet by mouth daily 90 tablet 1    medroxyPROGESTERone (DEPO-PROVERA) 150 MG/ML injection 1 ml Intramuscular       Current  Facility-Administered Medications   Medication Dose Route Frequency Provider Last Rate Last Admin    medroxyPROGESTERone (DEPO-PROVERA) injection 150 mg  150 mg IntraMUSCular Once Laren Boom, DO        medroxyPROGESTERone (DEPO-PROVERA) injection 150 mg  150 mg IntraMUSCular Q3 Months Laren Boom, DO   150 mg at 04/20/21 [id number]    medroxyPROGESTERone (DEPO-PROVERA) injection 150 mg  150 mg IntraMUSCular Q3 Months Laren Boom, DO   150 mg at 01/19/21 [id number]        ALLERGIES:    Allergies   Allergen Reactions    Shellfish Allergy Rash        HISTORY:  Past Medical History:   Diagnosis Date    Anxiety     Asthma       No past surgical history on file.   Social History     Socioeconomic History    Marital status: Single     Spouse name: Not on file  Number of children: Not on file    Years of education: Not on file    Highest education level: Not on file   Occupational History    Not on file   Tobacco Use    Smoking status: Never    Smokeless tobacco: Never   Vaping Use    Vaping Use: Never used   Substance and Sexual Activity    Alcohol use: Never    Drug use: Never    Sexual activity: Yes     Partners: Male   Other Topics Concern    Not on file   Social History Narrative    Not on file     Social Determinants of Health     Financial Resource Strain: Not on file   Food Insecurity: Not on file   Transportation Needs: Not on file   Physical Activity: Not on file   Stress: Not on file   Social Connections: Not on file   Intimate Partner Violence: Not on file   Housing Stability: Not on file      No family history on file.     REVIEW OF SYSTEMS:  Review of systems is as indicated in HPI otherwise negative.    PHYSICAL EXAM:  Physical Exam  Constitutional:       General: She is not in acute distress.     Appearance: Normal appearance. She is not ill-appearing, toxic-appearing or diaphoretic.   HENT:      Head: Normocephalic and atraumatic.      Right Ear: Ear canal normal.      Left Ear: Ear canal normal.       Mouth/Throat:      Mouth: Mucous membranes are moist.   Eyes:      Extraocular Movements: Extraocular movements intact.   Neck:      Vascular: No carotid bruit.   Cardiovascular:      Rate and Rhythm: Normal rate and regular rhythm.      Heart sounds: No murmur heard.     No gallop.   Pulmonary:      Effort: Pulmonary effort is normal. No respiratory distress.      Breath sounds: Normal breath sounds. No wheezing or rhonchi.   Abdominal:      General: Bowel sounds are normal. There is no distension.      Palpations: Abdomen is soft.      Tenderness: There is no abdominal tenderness. There is no guarding or rebound.   Musculoskeletal:      Right lower leg: No edema.      Left lower leg: No edema.   Skin:     General: Skin is warm.      Coloration: Skin is not jaundiced.      Findings: No erythema.   Neurological:      Mental Status: She is alert and oriented to person, place, and time.   Psychiatric:         Mood and Affect: Mood normal.         Behavior: Behavior normal.        Vital Signs -   Visit Vitals  BP 100/62 (Site: Left Upper Arm, Position: Sitting, Cuff Size: Small Adult)   Pulse 82   Ht 1.499 m (4\' 11" )   Wt 51.7 kg (114 lb)   SpO2 97%   BMI 23.03 kg/m              LABS  No results found for this visit on 07/15/22.  Office Visit on 04/28/2021   Component Date Value Ref Range Status    Valid Internal Control, POC 04/28/2021 yes   Final    Group A Strep Antigen, POC 04/28/2021 Negative  Negative Final    SARS-CoV-2 04/28/2021 Not Detected  Not Detected Final    Comment: Test performed at [facility] [address] [place] [address]  The cobas SARS-CoV-2 & Influenza A/B Nucleic acid test for use on the cobas  Liat System (cobas SARS-CoV-2 & Influenza A/B) is an automated multiplex  real-time RT-PCR assay intended for the simultaneous rapid in vitro  qualitative detection and differentiation of SARS-CoV-2, influenza A, and  influenza B virus RNA in healthcare provider-collected  nasopharyngeal and  nasal swabs, from individuals suspected of respiratory viral infection  consistent with COVID-19 by their healthcare provider.  Cobas SARS-CoV-2 & Influenza A/B is intended for use in the simultaneous rapid  in vitro detection and differentiation of SARS-CoV-2, influenza A virus, and  influenza B virus nucleic acids in clinical specimens and is not intended to  detect influenza C virus. SARS-CoV-2, influenza A and influenza B viral RNA is  generally detectable in respiratory specimens during the acute phase of  infection. Positive resul                           ts are indicative of active infection but do not rule  out bacterial infection or co-infection with other pathogens not detected by  the test. Clinical correlation with patient history and other diagnostic  information is necessary to determine patient infection status. The agent  detected may not be the definite cause of disease.  Negative results do not preclude SARS-CoV-2, influenza A and/or influenza B  infection and should not be used as the sole basis for diagnosis, treatment or  other patient management decisions. Negative results must be combined with  clinical observations, patient history, and/or epidemiological information.  The cobas SARS-CoV-2 & Influenza A/B Nucleic acid test is only for use under  the Food and Drug Administration's Emergency Use Authorization.      INFLUENZA A 04/28/2021 Not Detected  Not Detected Final    Comment: Test performed at [facility] [place] [address]      INFLUENZA B 04/28/2021 Not Detected  Not Detected Final    Comment: Test performed at [facility] [place] [address]         IMPRESSION/PLAN    Encounter Diagnoses   Name Primary?    Primary insomnia Yes    Anxiety     Encounter for Depo-Provera contraception     Anxiety with depression      1. Primary insomnia  Assessment & Plan:    patient reports symptoms controlled  on trazodone.  Patient continue good sleep hygiene.  Orders:  -     traZODone (DESYREL) 50 MG tablet; TAKE 1 TABLET BY MOUTH EVERY DAY AT BEDTIME AS NEEDED FOR 30 DAYS, Disp-90 tablet, R-3Normal  2. Anxiety  Assessment & Plan:    patient reports symptoms are generally well-controlled on Zoloft.  She states that sometimes she gets decreased appetite however.  She denies any symptoms of constipation, early satiety, or nausea.  Encourage patient to walk more as well as to look into some strength training exercises to help stimulate appetite.  3. Encounter for Depo-Provera contraception  Assessment & Plan:    patient  states that Depo-Provera is helping lighten her periods.  She still gets some regularly.  She denies any pica or fatigue.  Will recheck a urine pregnancy and administer new Depo-Provera shot.  She will be due for a Pap smear in 3 months on the time of her next Depo-Provera.  Orders:  -     medroxyPROGESTERone (DEPO-PROVERA) injection 150 mg; 150 mg, IntraMUSCular, ONCE, 1 dose, On Fri 07/15/22 at 1130  4. Anxiety with depression  -     traZODone (DESYREL) 50 MG tablet; TAKE 1 TABLET BY MOUTH EVERY DAY AT BEDTIME AS NEEDED FOR 30 DAYS, Disp-90 tablet, R-3Normal       Urine pregnancy is negative.  UA positive for trace blood and large leukocytes.  Patient without any overt UTI symptoms.  She did have some a few weeks ago but symptoms resolved with Azo.  Patient will notify me if she typical UTI-like symptoms.    Follow up and Dispositions:  Return in about 3 months (around 10/15/2022) for pap smear.       Laren Boom, DO

## 2022-07-15 NOTE — Assessment & Plan Note (Signed)
patient states that Depo-Provera is helping lighten her periods.  She still gets some regularly.  She denies any pica or fatigue.  Will recheck a urine pregnancy and administer new Depo-Provera shot.  She will be due for a Pap smear in 3 months on the time of her next Depo-Provera.

## 2022-07-15 NOTE — Assessment & Plan Note (Signed)
patient reports symptoms are generally well-controlled on Zoloft.  She states that sometimes she gets decreased appetite however.  She denies any symptoms of constipation, early satiety, or nausea.  Encourage patient to walk more as well as to look into some strength training exercises to help stimulate appetite.

## 2022-07-15 NOTE — Addendum Note (Signed)
Addended by: Cherly Anderson on: 07/15/2022 02:01 PM     Modules accepted: Orders

## 2022-10-14 NOTE — Telephone Encounter (Signed)
Patient is calling she wants to get her pap on the day she is off she is scheduled on 11/08/22 for pap

## 2022-10-14 NOTE — Telephone Encounter (Signed)
Left message on voice mail to call office.   She is due for pap, is she rescheduling pap and just  getting depo this month?

## 2022-10-14 NOTE — Telephone Encounter (Signed)
Appt changed to nurse visit patient scheduled in July for PAP

## 2022-10-14 NOTE — Telephone Encounter (Signed)
Patient has appt on 10-17-22, for Depo and pap, does not need pap at this time, will appt be changed to nurse visit, call patient if change to appt is needed

## 2022-10-17 ENCOUNTER — Encounter: Admit: 2022-10-17 | Discharge: 2022-10-17 | Attending: Family Medicine | Primary: Family Medicine

## 2022-10-17 DIAGNOSIS — Z3042 Encounter for surveillance of injectable contraceptive: Secondary | ICD-10-CM

## 2022-10-17 MED ORDER — MEDROXYPROGESTERONE ACETATE 150 MG/ML IM SUSP
150 | Freq: Once | INTRAMUSCULAR | Status: AC
Start: 2022-10-17 — End: 2022-10-17
  Administered 2022-10-17: 19:00:00 150 mg via INTRAMUSCULAR

## 2022-11-08 ENCOUNTER — Encounter: Attending: Student in an Organized Health Care Education/Training Program | Primary: Family Medicine

## 2023-01-17 ENCOUNTER — Encounter: Admit: 2023-01-17 | Discharge: 2023-01-17 | Primary: Family Medicine

## 2023-01-17 DIAGNOSIS — Z309 Encounter for contraceptive management, unspecified: Secondary | ICD-10-CM

## 2023-01-17 LAB — AMB POC URINE PREGNANCY TEST, VISUAL COLOR COMPARISON: HCG, Pregnancy, Urine, POC: NEGATIVE

## 2023-01-17 MED ORDER — MEDROXYPROGESTERONE ACETATE 150 MG/ML IM SUSY
150 | Freq: Once | INTRAMUSCULAR | Status: AC
Start: 2023-01-17 — End: 2023-01-17

## 2023-01-17 MED ADMIN — medroxyPROGESTERone (DEPO-PROVERA) injection 150 mg: 150 mg | INTRAMUSCULAR | @ 14:00:00 | NDC 55150033001

## 2023-04-18 ENCOUNTER — Encounter: Admit: 2023-04-18 | Discharge: 2023-04-18 | Primary: Family Medicine

## 2023-04-18 DIAGNOSIS — Z3042 Encounter for surveillance of injectable contraceptive: Secondary | ICD-10-CM

## 2023-04-18 MED ORDER — MEDROXYPROGESTERONE ACETATE 150 MG/ML IM SUSP
150 | Freq: Once | INTRAMUSCULAR | Status: AC
Start: 2023-04-18 — End: 2023-04-18
  Administered 2023-04-18: 14:00:00 150 mg via INTRAMUSCULAR

## 2023-07-17 ENCOUNTER — Encounter: Admit: 2023-07-17 | Discharge: 2023-07-17 | Primary: Family Medicine

## 2023-07-17 DIAGNOSIS — Z3042 Encounter for surveillance of injectable contraceptive: Secondary | ICD-10-CM

## 2023-07-17 MED ORDER — MEDROXYPROGESTERONE ACETATE 150 MG/ML IM SUSY
150 | Freq: Once | INTRAMUSCULAR | Status: AC
Start: 2023-07-17 — End: 2023-07-17
  Administered 2023-07-17: 16:00:00 150 mg via INTRAMUSCULAR

## 2023-10-17 ENCOUNTER — Ambulatory Visit: Admit: 2023-10-17 | Discharge: 2023-10-17 | Primary: Family Medicine

## 2023-10-17 DIAGNOSIS — Z309 Encounter for contraceptive management, unspecified: Secondary | ICD-10-CM

## 2023-10-17 LAB — AMB POC URINE PREGNANCY TEST, VISUAL COLOR COMPARISON: Valid Internal Control, POC: NEGATIVE

## 2023-10-17 MED ORDER — MEDROXYPROGESTERONE ACETATE 150 MG/ML IM SUSP
150 | Freq: Once | INTRAMUSCULAR | Status: AC
Start: 2023-10-17 — End: 2023-10-17
  Administered 2023-10-17: 13:00:00 150 mg via INTRAMUSCULAR

## 2024-01-17 ENCOUNTER — Ambulatory Visit: Admit: 2024-01-17 | Discharge: 2024-01-17 | Primary: Family Medicine

## 2024-01-17 MED ORDER — MEDROXYPROGESTERONE ACETATE 150 MG/ML IM SUSP
150 | Freq: Once | INTRAMUSCULAR | Status: AC
Start: 2024-01-17 — End: 2024-01-17
  Administered 2024-01-17: 13:00:00 150 mg via INTRAMUSCULAR

## 2024-02-14 NOTE — Telephone Encounter (Signed)
"  Pt is requesting a thyroid lab order be placed. Pt wanted to see Dr also but declined 05/30/24 OV.      Pls assist     "

## 2024-02-14 NOTE — Telephone Encounter (Signed)
"  Appt scheduled  "

## 2024-02-15 ENCOUNTER — Ambulatory Visit: Admit: 2024-02-15 | Discharge: 2024-02-15 | Attending: Family Medicine | Primary: Family Medicine

## 2024-02-15 VITALS — BP 110/68 | HR 99 | Ht 59.0 in | Wt 121.0 lb

## 2024-02-15 DIAGNOSIS — Z13220 Encounter for screening for lipoid disorders: Principal | ICD-10-CM

## 2024-02-15 MED ORDER — BUSPIRONE HCL 5 MG PO TABS
5 | ORAL_TABLET | Freq: Two times a day (BID) | ORAL | 5 refills | Status: AC
Start: 2024-02-15 — End: ?

## 2024-02-15 MED ORDER — TRAZODONE HCL 50 MG PO TABS
50 | ORAL_TABLET | ORAL | 3 refills | Status: AC
Start: 2024-02-15 — End: ?

## 2024-02-15 NOTE — Progress Notes (Signed)
 "    Last pap 2021        Heather Mcmillan (DOB:  [birth year]) is a 28 y.o. female, here for evaluation of the following chief complaint(s):  Annual Exam (CPE )         Assessment & Plan  1. Annual physical exam:  - She reports experiencing hot flashes and night sweats over the past month, primarily at night but also during the day. There have been no changes in activity, diet, or new medications.  - She is currently on Depo-Provera  for birth control and has been on it for a long time. The hot flashes could potentially be a side effect of Depo-Provera , which has a 1% incidence rate of such symptoms. She is too young to be menopausal or even perimenopausal.  - A thyroid study will be ordered to rule out hypothyroidism. Baseline labs, including CBC, iron level, and electrolytes, will be checked.  - If the tests return normal, a discussion with an OB/GYN about switching from Depo-Provera  to an IUD, possibly Skyla, will be considered.    2. Insomnia:  - She has been using alcohol to help her sleep instead of taking trazodone  as prescribed. She reports that trazodone  was effective when she took it but has stopped due to her alcohol consumption.  - She is advised to reduce her alcohol intake to no more than one glass of wine per night.  - Counseling and anxiety medications like BuSpar  are recommended to manage stress and anxiety.  - A prescription for BuSpar , one tablet twice daily, will be provided. Trazodone  will be refilled.    3. Anxiety:  - She reports high levels of stress and anxiety, partly due to her job and caregiving responsibilities for her grandmother.  - She is advised to seek counseling and consider medications like BuSpar  to manage her anxiety.  - A prescription for BuSpar , one tablet twice daily, will be provided.    4. Health maintenance:  - New screening labs for diabetes and hyperlipidemia will be ordered due to her family history of diabetes.  - She wishes to receive her influenza vaccine  today.    Follow-up: The patient will follow up in 6 weeks.    Results  Laboratory Studies  Last Pap smear was normal in 2021.  1. Primary insomnia  The following orders have not been finalized:  -     traZODone  (DESYREL ) 50 MG tablet  2. Anxiety with depression  The following orders have not been finalized:  -     traZODone  (DESYREL ) 50 MG tablet    No follow-ups on file.       Subjective   History of Present Illness  The patient is a 28 year old female who presents for an annual physical exam. She wishes to receive her influenza vaccine today and has concerns about an underactive thyroid. There is a family history of thyroid issues and diabetes. She reports experiencing symptoms of hot flashes and night sweats over the past month.    Hot flashes and night sweats have been occurring for the past month without any changes in activity level, diet, or medication regimen. These symptoms are more pronounced at night but can also occur during the day. For instance, she experienced a severe episode of sweating at work yesterday around 9:00 AM, which was followed by chills once she cooled down. This was an unusual occurrence as she typically feels cold rather than hot. She uses a ceiling fan and an additional fan used  by her boyfriend to manage these symptoms. She is currently on birth control and does not menstruate. She has been using Depo-Provera  for a long time and is concerned about potential health or hormonal issues related to its prolonged use. She is considering switching to an intrauterine device (IUD) and is curious about its duration of effectiveness and potential impact on future fertility.    She has been using alcohol as a sleep aid instead of trazodone , which she finds effective but avoids due to its contraindication with alcohol. She consumes a bottle of wine daily, particularly when stressed, and acknowledges the need to reduce her alcohol intake. She does not feel annoyed by others questioning her  drinking habits but does feel guilty about her alcohol consumption. She does not consume alcohol in the morning.    Alcohol: Consumes a bottle of wine daily, particularly when stressed  Sleep: Difficulty falling asleep, uses alcohol as a sleep aid  Living Condition: Lives with boyfriend    FAMILY HISTORY  The patient reports a family history of thyroid issues and diabetes.    Review of Systems       Objective   Blood pressure 110/68, pulse 99, height 1.499 m (4' 11), weight 54.9 kg (121 lb), SpO2 98%.       Physical Exam  Constitutional:       General: She is not in acute distress.     Appearance: Normal appearance. She is normal weight. She is not ill-appearing or diaphoretic.   HENT:      Head: Normocephalic and atraumatic.      Right Ear: External ear normal.      Left Ear: External ear normal.      Mouth/Throat:      Mouth: Mucous membranes are moist.      Pharynx: Oropharynx is clear.   Eyes:      Extraocular Movements: Extraocular movements intact.      Conjunctiva/sclera: Conjunctivae normal.   Neck:      Vascular: No carotid bruit.   Cardiovascular:      Rate and Rhythm: Normal rate and regular rhythm.      Heart sounds: No murmur heard.     No gallop.   Pulmonary:      Effort: Pulmonary effort is normal. No respiratory distress.      Breath sounds: No wheezing, rhonchi or rales.   Abdominal:      General: There is no distension.      Palpations: Abdomen is soft. There is no mass.      Tenderness: There is no abdominal tenderness. There is no guarding or rebound.   Musculoskeletal:         General: No swelling, tenderness or deformity. Normal range of motion.      Cervical back: Normal range of motion.   Skin:     General: Skin is warm and dry.      Coloration: Skin is not jaundiced or pale.      Findings: No bruising or erythema.   Neurological:      Mental Status: She is alert and oriented to person, place, and time.   Psychiatric:         Mood and Affect: Mood normal.         Behavior: Behavior normal.          Thought Content: Thought content normal.         Judgment: Judgment normal.           Reviewed PmHx,  RxHx, FmHx, SocHx, AllgHx and updated and dated in the chart.    CURRENT MEDS W/ ASSOC DIAG           Start Date End Date     medroxyPROGESTERone  (DEPO-PROVERA ) 150 MG/ML injection  --  --     Associated Diagnoses:  --     medroxyPROGESTERone  (DEPO-PROVERA ) injection 150 mg  01/19/21  --     150 mg, IntraMUSCular, EVERY 3 MONTHS, First dose on Tue 01/19/21 at 0947, Until Discontinued     Associated Diagnoses:  Encounter for Depo-Provera  contraception     medroxyPROGESTERone  (DEPO-PROVERA ) injection 150 mg  04/20/21  --     150 mg, IntraMUSCular, EVERY 3 MONTHS, First dose on Tue 04/20/21 at 0919, Until Discontinued     Associated Diagnoses:  Encounter for Depo-Provera  contraception     traZODone  (DESYREL ) 50 MG tablet  07/15/22  --     TAKE 1 TABLET BY MOUTH EVERY DAY AT BEDTIME AS NEEDED FOR 30 DAYS     Associated Diagnoses:  Primary insomnia, Anxiety with depression             Patient Active Problem List   Diagnosis Code    Anxiety F41.9    Constipation K59.00    Fibrocystic breast disease (FCBD) N60.19    Mild persistent asthma J45.30    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 The patient (or guardian, if applicable) and other individuals in attendance at the appointment consented to the use of AI, including the recording.      "

## 2024-04-02 ENCOUNTER — Encounter: Primary: Family Medicine

## 2024-04-17 ENCOUNTER — Ambulatory Visit: Admit: 2024-04-17 | Discharge: 2024-04-17 | Primary: Family Medicine

## 2024-04-17 DIAGNOSIS — Z3042 Encounter for surveillance of injectable contraceptive: Principal | ICD-10-CM

## 2024-04-17 MED ORDER — MEDROXYPROGESTERONE ACETATE 150 MG/ML IM SUSP
150 | Freq: Once | INTRAMUSCULAR | Status: AC
Start: 2024-04-17 — End: 2024-04-17
  Administered 2024-04-17: 14:00:00 150 mg via INTRAMUSCULAR
